# Patient Record
Sex: Female | Born: 1980 | Race: Black or African American | Hispanic: No | Marital: Married | Smoking: Former smoker
Health system: Southern US, Community
[De-identification: ages and names within clinical notes are randomized; demographics above are authoritative.]

## PROBLEM LIST (undated history)

## (undated) ENCOUNTER — Inpatient Hospital Stay (HOSPITAL_COMMUNITY): Payer: Self-pay

## (undated) ENCOUNTER — Inpatient Hospital Stay (HOSPITAL_COMMUNITY): Payer: BLUE CROSS/BLUE SHIELD | Admitting: Family Medicine

## (undated) DIAGNOSIS — D649 Anemia, unspecified: Secondary | ICD-10-CM

## (undated) DIAGNOSIS — Z3490 Encounter for supervision of normal pregnancy, unspecified, unspecified trimester: Secondary | ICD-10-CM

## (undated) HISTORY — DX: Anemia, unspecified: D64.9

---

## 2002-09-05 ENCOUNTER — Encounter: Payer: Self-pay | Admitting: Obstetrics and Gynecology

## 2002-09-05 ENCOUNTER — Ambulatory Visit (HOSPITAL_COMMUNITY): Admission: RE | Admit: 2002-09-05 | Discharge: 2002-09-05 | Payer: Self-pay | Admitting: Obstetrics and Gynecology

## 2003-01-07 ENCOUNTER — Inpatient Hospital Stay (HOSPITAL_COMMUNITY): Admission: AD | Admit: 2003-01-07 | Discharge: 2003-01-09 | Payer: Self-pay | Admitting: Obstetrics and Gynecology

## 2004-08-12 ENCOUNTER — Other Ambulatory Visit: Admission: RE | Admit: 2004-08-12 | Discharge: 2004-08-12 | Payer: Self-pay | Admitting: Obstetrics and Gynecology

## 2005-08-31 ENCOUNTER — Other Ambulatory Visit: Admission: RE | Admit: 2005-08-31 | Discharge: 2005-08-31 | Payer: Self-pay | Admitting: Obstetrics and Gynecology

## 2006-03-28 ENCOUNTER — Emergency Department (HOSPITAL_COMMUNITY): Admission: EM | Admit: 2006-03-28 | Discharge: 2006-03-28 | Payer: Self-pay | Admitting: Emergency Medicine

## 2008-09-25 ENCOUNTER — Inpatient Hospital Stay (HOSPITAL_COMMUNITY): Admission: AD | Admit: 2008-09-25 | Discharge: 2008-09-27 | Payer: Self-pay | Admitting: Obstetrics and Gynecology

## 2008-09-25 ENCOUNTER — Encounter (INDEPENDENT_AMBULATORY_CARE_PROVIDER_SITE_OTHER): Payer: Self-pay | Admitting: Obstetrics and Gynecology

## 2008-09-30 ENCOUNTER — Encounter: Admission: RE | Admit: 2008-09-30 | Discharge: 2008-10-23 | Payer: Self-pay | Admitting: Obstetrics and Gynecology

## 2009-06-12 ENCOUNTER — Emergency Department (HOSPITAL_COMMUNITY): Admission: EM | Admit: 2009-06-12 | Discharge: 2009-06-12 | Payer: Self-pay | Admitting: Emergency Medicine

## 2010-06-17 ENCOUNTER — Emergency Department (HOSPITAL_COMMUNITY): Admission: EM | Admit: 2010-06-17 | Discharge: 2010-06-17 | Payer: Self-pay | Admitting: Emergency Medicine

## 2010-08-11 ENCOUNTER — Emergency Department (HOSPITAL_COMMUNITY)
Admission: EM | Admit: 2010-08-11 | Discharge: 2010-08-11 | Payer: Self-pay | Source: Home / Self Care | Admitting: Family Medicine

## 2010-11-04 LAB — POCT RAPID STREP A (OFFICE): Streptococcus, Group A Screen (Direct): NEGATIVE

## 2010-12-08 LAB — CCBB MATERNAL DONOR DRAW

## 2010-12-08 LAB — RPR: RPR Ser Ql: NONREACTIVE

## 2010-12-08 LAB — CBC
HCT: 35.4 % — ABNORMAL LOW (ref 36.0–46.0)
HCT: 36.8 % (ref 36.0–46.0)
Hemoglobin: 11.7 g/dL — ABNORMAL LOW (ref 12.0–15.0)
MCHC: 33.1 g/dL (ref 30.0–36.0)
MCV: 87.2 fL (ref 78.0–100.0)
Platelets: 166 10*3/uL (ref 150–400)
Platelets: 168 10*3/uL (ref 150–400)
RBC: 4.06 MIL/uL (ref 3.87–5.11)
RDW: 14 % (ref 11.5–15.5)
RDW: 14.1 % (ref 11.5–15.5)
WBC: 11.8 10*3/uL — ABNORMAL HIGH (ref 4.0–10.5)
WBC: 15.4 10*3/uL — ABNORMAL HIGH (ref 4.0–10.5)

## 2010-12-08 LAB — STREP B DNA PROBE: Strep Group B Ag: NEGATIVE

## 2011-01-05 NOTE — Discharge Summary (Signed)
NAMECAMERIN, [name]                ACCOUNT NO.:  [id number]   MEDICAL RECORD NO.:  [id number]          PATIENT TYPE:  INP   LOCATION:  9146                          FACILITY:  [facility]   PHYSICIAN:  Sherron Monday, MD        DATE OF BIRTH:  1980/10/24   DATE OF ADMISSION:  09/25/2008  DATE OF DISCHARGE:  09/27/2008                               DISCHARGE SUMMARY   ADMITTING DIAGNOSIS:  Intrauterine pregnancy at 35 weeks with  spontaneous rupture of membranes.   DISCHARGE DIAGNOSIS:  Intrauterine pregnancy at 35 weeks with  spontaneous rupture of membranes.   HISTORY OF PRESENT ILLNESS:  A 30 year old G2, P1 at 35 weeks with  spontaneous rupture of membranes confirmed in MAU with an [facility] of October 27, 2008.  Her pregnancy is dated by her last menstrual period consistent  with an early ultrasound.  She had uncomplicated prenatal care aside  from early dilatation.   PAST MEDICAL HISTORY:  History of postpartum depression.   PAST SURGICAL HISTORY:  None.   PAST OB/GYN HISTORY:  G1 was a term spontaneous vaginal delivery of a  female infant.  G2 is the current pregnancy.  No abnormal Pap smears or  sexually transmitted diseases.   MEDICATIONS:  Prenatal vitamins.   ALLERGIES:  No known drug allergies.   SOCIAL HISTORY:  Denies alcohol, tobacco, or drug use.   FAMILY HISTORY:  Significant for hypertension in her mother.   PRENATAL LABORATORY DATA:  O positive, antibody screen negative.  Rubella immune.  RPR nonreactive.  Hepatitis C surface antigen negative.  HIV negative.  Gonorrhea and Chlamydia negative.  First trimester screen  within normal limits.  Cystic fibrosis screen negative.  AFP within  normal limits.  Glucola of 131.  History of positive group B strep, has  not been evaluated in this pregnancy.   PHYSICAL EXAMINATION:  On admission, she was afebrile.  Vital signs  stable with a benign exam.  Fetal heart tones are reactive.  Cervix is  noted to be 3 cm dilated, 70%  effaced, and -2 station.  Rupture of  membranes was again confirmed.  She was admitted and given penicillin  for group B strep prophylaxis and she was given epidural for comfort.  Her labor progressed to complete, complete + 2, with minimal amount of  Pitocin.  She pushed very well at times for approximately 10 minutes to  deliver a viable female infant at 78 on September 25, 2008, with a weight  of 6 pounds 4 ounces with Apgars of 8 at one minute and 9 at five  minutes.  Placenta was expressed intact.  Hemostatic perineal laceration  was the only laceration.  EBL was less than 500 mL.  Infant was  delivered with a loose nuchal cord.  NICU was present for delivery.  The  postpartum course was relatively uncomplicated.  She remained afebrile  with vital signs stable.  She was discharged to home on postpartum day  #2 with normal lochia.  Pain controlled, ambulating well, and tolerating  p.o.  She was breastfeeding without difficulty.  Hemoglobin had actually  increased from 11.7 to 12.1.  She was discharged to home with routine  discharge instructions and numbers to call if any questions or problems  as well as prescriptions for Motrin, Vicodin, prenatal vitamins, and  Zoloft secondary to a history of postpartum depression.  She will follow  up in 6 weeks and she knows to call with any questions with her meds.  She is O positive and rubella immune.  She continues both breast and  bottle feeds.  She will use an IUD for postpartum contraception.  This  will be placed at her postpartum checkup, and her hemoglobin changed  from 11.7 to 12.1.      Sherron Monday, MD  Electronically Signed     JB/MEDQ  D:  09/27/2008  T:  09/27/2008  Job:  [id number]

## 2011-01-08 NOTE — Discharge Summary (Signed)
NAMEKAMIL, HANIGAN                          ACCOUNT NO.:  [id number]   MEDICAL RECORD NO.:  [id number]                   PATIENT TYPE:  INP   LOCATION:  9126                                 [facility]ILITY:  [facility]   PHYSICIAN:  Huel Cote, M.D.              DATE OF BIRTH:  08-Aug-1981   DATE OF ADMISSION:  01/07/2003  DATE OF DISCHARGE:  01/09/2003                                 DISCHARGE SUMMARY   DISCHARGE DIAGNOSES:  1. Term pregnancy at 39 weeks, delivered.  2. Status post normal spontaneous vaginal delivery.  3. Group B Streptococcus carrier.   DISCHARGE MEDICATIONS:  Motrin 600 mg p.o. every six hours.   DISCHARGE FOLLOW UP:  Patient is to follow up in six weeks for her routine  postpartum exam.   HOSPITAL COURSE:  Patient is a 30 year old G1, P0 who was admitted at 39+  weeks for labor and delivery for induction of labor given a very favorable  cervix and possibility of her husband having to travel soon with [employer]  training.  Prenatal care was uncomplicated except for a positive group B  strep status.  Patient presented with mild contractions, no leakage of fluid  or vaginal bleeding, and good fetal movement.  Prenatal labs were as  follows:  O positive, antibody negative, RPR nonreactive, Rubella immune,  hepatitis B surface antigen negative, HIV negative, GC negative, Chlamydia  negative, triple screen negative, CF negative, sickle negative, and group B  strep positive.  She had no obstetrical history.   PAST GYNECOLOGICAL HISTORY:  1. Remote history of Chlamydia, which was treated.  2. 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   MEDICATIONS:  None.   PAST SURGICAL HISTORY:  None.   ALLERGIES:  She has no known drug allergies.   PHYSICAL EXAMINATION:  VITAL SIGNS:  She was afebrile with stable vital  signs.  ABDOMEN:  Fetal heart rate was reactive.  She was gravid and nontender.  Estimated fetal weight was 7 to 7-1/2 pounds.  PELVIC:  Cervix was 2 to 3, 75%, and a -2 station.   HOSPITAL COURSE:  She had rupture of membranes performed with clear fluid  obtained and was placed on Pitocin, per low-dose protocol.  She progressed  throughout the day and reached complete dilation and pushed for  approximately 30 minutes with a normal spontaneous vaginal delivery of a  vigorous female infant.  Apgars were 9 and 9.  Weight was 7 pounds 7 ounces.  Placenta delivered spontaneously with a three-vessel cord.  There was a  small, first-degree laceration repaired with 2-0 Vicryl for hemostasis, and  the cervix and rectum were intact.  Estimated blood loss was 350 mL.  She was then admitted for routine  postpartum care and did very well and on postpartum day number two, was  afebrile and having no pain.  She was therefore felt stable for discharge  and was discharged home to follow up in the office as previously stated in  six weeks.                                               Huel Cote, M.D.    KR/MEDQ  D:  01/09/2003  T:  01/09/2003  Job:  [id number]

## 2011-02-08 ENCOUNTER — Telehealth: Payer: Self-pay | Admitting: *Deleted

## 2011-02-08 ENCOUNTER — Ambulatory Visit: Payer: Self-pay | Admitting: Family Medicine

## 2011-02-08 NOTE — Telephone Encounter (Signed)
New Pt appt (30 min) No Show.  I called pt cell and left a VM regarding our office policy, please call to explain reason for missed appt.

## 2012-08-06 ENCOUNTER — Emergency Department (HOSPITAL_COMMUNITY)
Admission: EM | Admit: 2012-08-06 | Discharge: 2012-08-06 | Disposition: A | Discharge status: Home / Self Care | Payer: BC Managed Care – PPO | Source: Home / Self Care

## 2012-12-14 ENCOUNTER — Emergency Department (INDEPENDENT_AMBULATORY_CARE_PROVIDER_SITE_OTHER)
Admission: EM | Admit: 2012-12-14 | Discharge: 2012-12-14 | Disposition: A | Discharge status: Home / Self Care | Payer: BC Managed Care – PPO | Source: Home / Self Care | Attending: Family Medicine | Admitting: Family Medicine

## 2012-12-14 ENCOUNTER — Encounter (HOSPITAL_COMMUNITY): Payer: Self-pay | Admitting: *Deleted

## 2012-12-14 DIAGNOSIS — S61311A Laceration without foreign body of left index finger with damage to nail, initial encounter: Secondary | ICD-10-CM

## 2012-12-14 DIAGNOSIS — S61209A Unspecified open wound of unspecified finger without damage to nail, initial encounter: Secondary | ICD-10-CM

## 2012-12-14 NOTE — ED Provider Notes (Signed)
History     CSN: [id number]  Arrival date & time 12/14/12  1844   First MD Initiated Contact with Patient 12/14/12 1917      No chief complaint on file.   (Consider location/radiation/quality/duration/timing/severity/associated sxs/prior treatment) Patient is a 32 y.o. female presenting with hand pain. The history is provided by the patient. No language interpreter was used.  Hand Pain This is a new problem. The current episode started less than 1 hour ago. The problem occurs constantly. The problem has not changed since onset.Nothing aggravates the symptoms. Nothing relieves the symptoms. She has tried nothing for the symptoms.   Pt cut left index finger with a knife No past medical history on file.  No past surgical history on file.  No family history on file.  History  Substance Use Topics  . Smoking status: Not on file  . Smokeless tobacco: Not on file  . Alcohol Use: Not on file    OB History   No data available      Review of Systems  All other systems reviewed and are negative.    Allergies  Review of patient's allergies indicates not on file.  Home Medications  No current outpatient prescriptions on file.  BP 109/70  Pulse 77  Temp(Src) 98.1 F (36.7 C) (Oral)  Resp 16  SpO2 100%  Physical Exam  Constitutional: She is oriented to person, place, and time.  Musculoskeletal: She exhibits tenderness.  1cm superficial laceration left index finger  Neurological: She is alert and oriented to person, place, and time. She has normal reflexes.  Skin: Skin is warm.  Psychiatric: She has a normal mood and affect.    ED Course  LACERATION REPAIR Date/Time: 12/14/2012 7:21 PM Performed by: Elson Areas Authorized by: Bradd Canary D Consent: Verbal consent obtained. Risks and benefits: risks, benefits and alternatives were discussed Consent given by: patient Required items: required blood products, implants, devices, and special equipment  available Patient identity confirmed: verbally with patient Laceration length: 1 cm Foreign bodies: no foreign bodies Tendon involvement: none Irrigation solution: saline Amount of cleaning: standard Skin closure: glue Patient tolerance: Patient tolerated the procedure well with no immediate complications.   (including critical care time)  Labs Reviewed - No data to display No results found.   1. Laceration of left index finger w/o foreign body with damage to nail, initial encounter       MDM  Pt counseled on wound care.        Lonia Skinner [place], PA-C 12/14/12 1921

## 2012-12-14 NOTE — ED Notes (Signed)
Lac  To  l  Pointer         And    Cut  Finger  On  Can   Lid            Bleeding       Subsided

## 2012-12-15 NOTE — ED Provider Notes (Signed)
Medical screening examination/treatment/procedure(s) were performed by resident physician or non-physician practitioner and as supervising physician I was immediately available for consultation/collaboration.   Monterio Bob DOUGLAS MD.   Callen Zuba D Elianis Fischbach, MD 12/15/12 2024 

## 2015-05-15 ENCOUNTER — Ambulatory Visit (INDEPENDENT_AMBULATORY_CARE_PROVIDER_SITE_OTHER): Payer: BLUE CROSS/BLUE SHIELD | Admitting: Physician Assistant

## 2015-05-15 ENCOUNTER — Encounter: Payer: Self-pay | Admitting: Physician Assistant

## 2015-05-15 VITALS — BP 114/70 | HR 72 | Temp 98.1°F | Resp 16 | Ht 66.0 in | Wt 172.0 lb

## 2015-05-15 DIAGNOSIS — Z0000 Encounter for general adult medical examination without abnormal findings: Secondary | ICD-10-CM | POA: Diagnosis not present

## 2015-05-15 DIAGNOSIS — Z23 Encounter for immunization: Secondary | ICD-10-CM

## 2015-05-15 LAB — POCT URINALYSIS DIPSTICK
BILIRUBIN UA: NEGATIVE
GLUCOSE UA: NEGATIVE
Ketones, UA: NEGATIVE
Leukocytes, UA: NEGATIVE
Nitrite, UA: NEGATIVE
Protein, UA: NEGATIVE
RBC UA: NEGATIVE
SPEC GRAV UA: 1.015
Urobilinogen, UA: 0.2
pH, UA: 7.5

## 2015-05-15 NOTE — Progress Notes (Signed)
Patient: [name], Female    DOB: 05/15/1981, 34 y.o.   MRN: [id number] Visit Date: 05/15/2015  Today's Provider: Mar Daring, PA-C   Chief Complaint  Patient presents with  . Establish Care  . Annual Exam   Subjective:    Annual physical exam [name] is a 34 y.o. female who presents today for health maintenance and complete physical. She feels well. She reports not exercising . She reports she is sleeping well. She is doing well today and has no complaints. 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 She is followed by Dr. Christophe Louis in [place] for Ob/Gyn. She had a Pap smear March of this year and is reported normal. She has had 2 children age 55, girl and 4, boy. She has never had any abnormal Pap smears. There is no family history of ovarian or cervical cancer. There is no family history of breast cancer. She does perform monthly self breast exams. There is no family history of colon cancer. 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  Pap Smear: 10/2014 -----------------------------------------------------------------   Review of Systems  Constitutional: Negative.   HENT: Negative.   Eyes: Negative.   Respiratory: Negative.   Cardiovascular: Negative.   Gastrointestinal: Negative.   Endocrine: Negative.   Genitourinary: Negative.   Musculoskeletal: Negative.   Skin: Negative.   Allergic/Immunologic: Negative.   Neurological: Negative.   Hematological: Negative.   Psychiatric/Behavioral: Negative.     Social History She  reports that she has never smoked. She does not have any smokeless tobacco history on file. She reports that she does not drink alcohol. Social History   Social History  . Marital Status: Married    Spouse Name: N/A  . Number of Children: N/A  . Years of Education: N/A   Social History Main Topics    . Smoking status: Never Smoker   . Smokeless tobacco: None  . Alcohol Use: No  . Drug Use: None  . Sexual Activity: Not Asked   Other Topics Concern  . None   Social History Narrative    There are no active problems to display for this patient.   History reviewed. No pertinent past surgical history.  Family History  Family Status  Relation Status Death Age  . Mother Alive   . Father Deceased 81    Lung Cancer   Her family history is not on file.    No Known Allergies  Previous Medications   MULTIPLE VITAMIN (MULTIVITAMINS PO)    Take by mouth daily.    Patient Care Team: Mar Daring, PA-C as PCP - General (Physician Assistant)     Objective:   Vitals: BP 114/70 mmHg  Pulse 72  Temp(Src) 98.1 F (36.7 C) (Oral)  Resp 16  Ht $R'5\' 6"'yI$  (1.676 m)  Wt 172 lb (78.019 kg)  BMI 27.77 kg/m2  LMP 01/22/2015   Physical Exam  Constitutional: She is oriented to person, place, and time. She appears well-developed and well-nourished. No distress.  HENT:  Head: Normocephalic and atraumatic.  Right Ear: External ear normal.  Left Ear: External ear normal.  Nose: Nose normal.  Mouth/Throat: Oropharynx is clear and moist. No oropharyngeal exudate.  Eyes: Conjunctivae and EOM are normal. Pupils are equal, round, and reactive to light. Right eye exhibits no discharge. Left eye exhibits no discharge.  No scleral icterus.  Neck: Normal range of motion. Neck supple. No JVD present. No tracheal deviation present. No thyromegaly present.  Cardiovascular: Normal rate, regular rhythm, normal heart sounds and intact distal pulses.  Exam reveals no gallop and no friction rub.   No murmur heard. Pulmonary/Chest: Effort normal and breath sounds normal. No respiratory distress. She has no wheezes. She has no rales. She exhibits no tenderness.  Abdominal: Soft. Bowel sounds are normal. She exhibits no distension and no mass. There is no tenderness. There is no rebound and no guarding.   Genitourinary:  Done by Dr. Landry Mellow, [facility]  Musculoskeletal: Normal range of motion. She exhibits no edema or tenderness.  Lymphadenopathy:    She has no cervical adenopathy.  Neurological: She is alert and oriented to person, place, and time.  Skin: Skin is warm and dry. No rash noted. She is not diaphoretic.  Psychiatric: She has a normal mood and affect. Her behavior is normal. Judgment and thought content normal.  Vitals reviewed.    Depression Screen No flowsheet data found.    Assessment & Plan:     Routine Health Maintenance and Physical Exam  Exercise Activities and Dietary recommendations Goals    None      Immunization History  Administered Date(s) Administered  . Hepatitis B 04/09/2013  . MMR 02/16/2007  . Tdap 02/16/2007  . Varicella 03/17/2005    Health Maintenance  Topic Date Due  . HIV Screening  11/02/1997  . TETANUS/TDAP  03/24/2007  . PAP SMEAR  11/02/2017  . INFLUENZA VACCINE  03/24/2015      Discussed health benefits of physical activity, and encouraged her to engage in regular exercise appropriate for her age and condition.   1. Annual physical exam I will check labs as she has never had any lab work done. I will follow-up with her pending lab results. - CBC with Differential - Lipid panel - Comprehensive Metabolic Panel (CMET) - TSH - POCT Urinalysis Dipstick  2. Need for influenza vaccination Flu vaccine given today in the office. She tolerated this well. - Flu Vaccine QUAD 36+ mos IM  --------------------------------------------------------------------

## 2015-05-15 NOTE — Patient Instructions (Signed)

## 2015-05-22 ENCOUNTER — Telehealth: Payer: Self-pay

## 2015-05-22 LAB — COMPREHENSIVE METABOLIC PANEL
A/G RATIO: 1.6 (ref 1.1–2.5)
ALT: 18 IU/L (ref 0–32)
AST: 15 IU/L (ref 0–40)
Albumin: 4.4 g/dL (ref 3.5–5.5)
Alkaline Phosphatase: 65 IU/L (ref 39–117)
BUN/Creatinine Ratio: 12 (ref 8–20)
BUN: 10 mg/dL (ref 6–20)
Bilirubin Total: 0.3 mg/dL (ref 0.0–1.2)
CALCIUM: 9.7 mg/dL (ref 8.7–10.2)
CO2: 22 mmol/L (ref 18–29)
Chloride: 100 mmol/L (ref 97–108)
Creatinine, Ser: 0.85 mg/dL (ref 0.57–1.00)
GFR, EST AFRICAN AMERICAN: 105 mL/min/{1.73_m2} (ref >59)
GFR, EST NON AFRICAN AMERICAN: 91 mL/min/{1.73_m2} (ref >59)
GLUCOSE: 88 mg/dL (ref 65–99)
Globulin, Total: 2.7 g/dL (ref 1.5–4.5)
POTASSIUM: 4.7 mmol/L (ref 3.5–5.2)
Sodium: 140 mmol/L (ref 134–144)
TOTAL PROTEIN: 7.1 g/dL (ref 6.0–8.5)

## 2015-05-22 LAB — LIPID PANEL
CHOL/HDL RATIO: 3 ratio (ref 0.0–4.4)
Cholesterol, Total: 209 mg/dL — ABNORMAL HIGH (ref 100–199)
HDL: 70 mg/dL (ref >39)
LDL CALC: 120 mg/dL — AB (ref 0–99)
TRIGLYCERIDES: 94 mg/dL (ref 0–149)
VLDL Cholesterol Cal: 19 mg/dL (ref 5–40)

## 2015-05-22 LAB — CBC WITH DIFFERENTIAL/PLATELET
Basophils Absolute: 0 10*3/uL (ref 0.0–0.2)
Basos: 1 %
EOS (ABSOLUTE): 0.1 10*3/uL (ref 0.0–0.4)
EOS: 2 %
HEMATOCRIT: 40.9 % (ref 34.0–46.6)
HEMOGLOBIN: 13.5 g/dL (ref 11.1–15.9)
Immature Grans (Abs): 0 10*3/uL (ref 0.0–0.1)
Immature Granulocytes: 0 %
LYMPHS ABS: 2.5 10*3/uL (ref 0.7–3.1)
Lymphs: 48 %
MCH: 26.3 pg — AB (ref 26.6–33.0)
MCHC: 33 g/dL (ref 31.5–35.7)
MCV: 80 fL (ref 79–97)
MONOCYTES: 8 %
MONOS ABS: 0.4 10*3/uL (ref 0.1–0.9)
NEUTROS ABS: 2.2 10*3/uL (ref 1.4–7.0)
Neutrophils: 41 %
Platelets: 203 10*3/uL (ref 150–379)
RBC: 5.14 x10E6/uL (ref 3.77–5.28)
RDW: 13.8 % (ref 12.3–15.4)
WBC: 5.3 10*3/uL (ref 3.4–10.8)

## 2015-05-22 LAB — TSH: TSH: 0.928 u[IU]/mL (ref 0.450–4.500)

## 2015-05-22 NOTE — Telephone Encounter (Signed)
Patient advised as directed below.  Thanks,  -Joseline 

## 2015-05-22 NOTE — Telephone Encounter (Signed)
-----   Message from Mar Daring, [place] sent at 05/22/2015  8:55 AM EDT ----- All labs are stable and WNL with exception of cholesterol which is only borderline elevated.  Good cholesterol is elevated also which offers cardioprotection.  Try to limit high cholesterol and high fat foods.  Exercise at least 3-4 times per week.  We will recheck next year with physical.

## 2016-01-04 ENCOUNTER — Inpatient Hospital Stay (HOSPITAL_COMMUNITY)
Admission: AD | Admit: 2016-01-04 | Discharge: 2016-01-05 | Disposition: A | Discharge status: Home / Self Care | Payer: BLUE CROSS/BLUE SHIELD | Source: Ambulatory Visit | Attending: Obstetrics & Gynecology | Admitting: Obstetrics & Gynecology

## 2016-01-04 ENCOUNTER — Encounter (HOSPITAL_COMMUNITY): Payer: Self-pay | Admitting: *Deleted

## 2016-01-04 DIAGNOSIS — Z3A08 8 weeks gestation of pregnancy: Secondary | ICD-10-CM | POA: Insufficient documentation

## 2016-01-04 DIAGNOSIS — O209 Hemorrhage in early pregnancy, unspecified: Secondary | ICD-10-CM | POA: Diagnosis not present

## 2016-01-04 DIAGNOSIS — Z87891 Personal history of nicotine dependence: Secondary | ICD-10-CM | POA: Insufficient documentation

## 2016-01-04 DIAGNOSIS — O43891 Other placental disorders, first trimester: Secondary | ICD-10-CM

## 2016-01-04 DIAGNOSIS — O4691 Antepartum hemorrhage, unspecified, first trimester: Secondary | ICD-10-CM | POA: Diagnosis not present

## 2016-01-04 LAB — CBC
HCT: 36.4 % (ref 36.0–46.0)
Hemoglobin: 12 g/dL (ref 12.0–15.0)
MCH: 26.2 pg (ref 26.0–34.0)
MCHC: 33 g/dL (ref 30.0–36.0)
MCV: 79.5 fL (ref 78.0–100.0)
PLATELETS: 202 10*3/uL (ref 150–400)
RBC: 4.58 MIL/uL (ref 3.87–5.11)
RDW: 14.2 % (ref 11.5–15.5)
WBC: 9.3 10*3/uL (ref 4.0–10.5)

## 2016-01-04 LAB — POCT PREGNANCY, URINE: Preg Test, Ur: POSITIVE — AB

## 2016-01-04 LAB — OB RESULTS CONSOLE GC/CHLAMYDIA: Gonorrhea: NEGATIVE

## 2016-01-04 NOTE — MAU Note (Signed)
Pt noticed some vaginal bleeding and cramping that started about an hour ago. Noticed bright red blood when wiping. Suppose to have appointment with Dr Landry Mellow tomorrow. Was given EDD of 08/17/2016 by [employer] in [place].

## 2016-01-04 NOTE — MAU Note (Signed)
PT  SAYS SHE STARTED HAVING CRAMPING  AT 9PM  AND VAG BLEEDING  AT 2300   WHEN SHE WIPED-   RED.   IN TRIAGE - LESS  RED  WHEN WIPING        HAS AN APPOINTMENT  TOMORROW  WITH DR  Landry Mellow.      HAD SEX THIS  AM.

## 2016-01-05 ENCOUNTER — Telehealth: Payer: Self-pay | Admitting: Obstetrics and Gynecology

## 2016-01-05 ENCOUNTER — Inpatient Hospital Stay (HOSPITAL_COMMUNITY): Payer: BLUE CROSS/BLUE SHIELD

## 2016-01-05 DIAGNOSIS — O4691 Antepartum hemorrhage, unspecified, first trimester: Secondary | ICD-10-CM

## 2016-01-05 LAB — WET PREP, GENITAL
Clue Cells Wet Prep HPF POC: NONE SEEN
SPERM: NONE SEEN
TRICH WET PREP: NONE SEEN
YEAST WET PREP: NONE SEEN

## 2016-01-05 LAB — URINALYSIS, ROUTINE W REFLEX MICROSCOPIC
Bilirubin Urine: NEGATIVE
Glucose, UA: NEGATIVE mg/dL
Ketones, ur: NEGATIVE mg/dL
NITRITE: NEGATIVE
Protein, ur: NEGATIVE mg/dL
SPECIFIC GRAVITY, URINE: 1.01 (ref 1.005–1.030)
pH: 8 (ref 5.0–8.0)

## 2016-01-05 LAB — ABO/RH: ABO/RH(D): O POS

## 2016-01-05 LAB — URINE MICROSCOPIC-ADD ON

## 2016-01-05 LAB — GC/CHLAMYDIA PROBE AMP (~~LOC~~) NOT AT ARMC
Chlamydia: NEGATIVE
Neisseria Gonorrhea: NEGATIVE

## 2016-01-05 LAB — HCG, QUANTITATIVE, PREGNANCY: hCG, Beta Chain, Quant, S: 112767 m[IU]/mL — ABNORMAL HIGH (ref <5)

## 2016-01-05 NOTE — MAU Provider Note (Signed)
History     CSN: [id number]  Arrival date and time: 01/04/16 2313   First Provider Initiated Contact with Patient 01/05/16 0003      Chief Complaint  Patient presents with  . Vaginal Bleeding  . Pelvic Pain   Vaginal Bleeding This is a new problem. The current episode started today. The problem occurs intermittently. The problem has been resolved. The patient is experiencing no pain. The problem affects both sides. She is pregnant. Associated symptoms include abdominal pain and constipation (took milk of mag tonight. Has not had a BM as of yet. ). Pertinent negatives include no chills, diarrhea, dysuria, fever, frequency, nausea, urgency or vomiting. The vaginal discharge was bloody. The vaginal bleeding is spotting (only when wiping ). She has not been passing clots. She has not been passing tissue. The symptoms are aggravated by intercourse (patient had intercourse earlier today ). She has tried acetaminophen for the symptoms. The treatment provided significant relief. She is sexually active. It is unknown whether or not her partner has an STD. Her menstrual history has been irregular (LMP: 11/13/15, pregnant via IUI and ovulation induction ).    History reviewed. No pertinent past medical history.  History reviewed. No pertinent past surgical history.  History reviewed. No pertinent family history.  Social History  Substance Use Topics  . Smoking status: Former Research scientist (life sciences)  . Smokeless tobacco: None  . Alcohol Use: No    Allergies: No Known Allergies  Prescriptions prior to admission  Medication Sig Dispense Refill Last Dose  . Multiple Vitamin (MULTIVITAMINS PO) Take by mouth daily.   01/04/2016 at Unknown time    Review of Systems  Constitutional: Negative for fever and chills.  Gastrointestinal: Positive for abdominal pain and constipation (took milk of mag tonight. Has not had a BM as of yet. ). Negative for nausea, vomiting and diarrhea.  Genitourinary: Positive for vaginal  bleeding. Negative for dysuria, urgency and frequency.   Physical Exam   Blood pressure 123/70, pulse 90, temperature 98.6 F (37 C), temperature source Oral, resp. rate 16, height 5\' 6"  (1.676 m), weight 85.276 kg (188 lb), last menstrual period 11/13/2015, SpO2 100 %.  Physical Exam  Nursing note and vitals reviewed. Constitutional: She is oriented to person, place, and time. She appears well-developed and well-nourished. No distress.  HENT:  Head: Normocephalic.  Cardiovascular: Normal rate.   Respiratory: Effort normal.  GI: Soft. There is no tenderness. There is no rebound.  Musculoskeletal: Normal range of motion.  Neurological: She is alert and oriented to person, place, and time.  Skin: Skin is warm and dry.  Psychiatric: She has a normal mood and affect.   Results for orders placed or performed during the hospital encounter of 01/04/16 (from the past 24 hour(s))  Urinalysis, Routine w reflex microscopic (not at [facility])     Status: Abnormal   Collection Time: 01/04/16 11:22 PM  Result Value Ref Range   Color, Urine YELLOW YELLOW   APPearance CLEAR CLEAR   Specific Gravity, Urine 1.010 1.005 - 1.030   pH 8.0 5.0 - 8.0   Glucose, UA NEGATIVE NEGATIVE mg/dL   Hgb urine dipstick LARGE (A) NEGATIVE   Bilirubin Urine NEGATIVE NEGATIVE   Ketones, ur NEGATIVE NEGATIVE mg/dL   Protein, ur NEGATIVE NEGATIVE mg/dL   Nitrite NEGATIVE NEGATIVE   Leukocytes, UA TRACE (A) NEGATIVE  Urine microscopic-add on     Status: Abnormal   Collection Time: 01/04/16 11:22 PM  Result Value Ref Range   Squamous Epithelial /  LPF 0-5 (A) NONE SEEN   WBC, UA 0-5 0 - 5 WBC/hpf   RBC / HPF 0-5 0 - 5 RBC/hpf   Bacteria, UA FEW (A) NONE SEEN  CBC     Status: None   Collection Time: 01/04/16 11:32 PM  Result Value Ref Range   WBC 9.3 4.0 - 10.5 K/uL   RBC 4.58 3.87 - 5.11 MIL/uL   Hemoglobin 12.0 12.0 - 15.0 g/dL   HCT 36.4 36.0 - 46.0 %   MCV 79.5 78.0 - 100.0 fL   MCH 26.2 26.0 - 34.0 pg   MCHC  33.0 30.0 - 36.0 g/dL   RDW 14.2 11.5 - 15.5 %   Platelets 202 150 - 400 K/uL  hCG, quantitative, pregnancy     Status: Abnormal   Collection Time: 01/04/16 11:32 PM  Result Value Ref Range   hCG, Beta Chain, [place], S 112767 (H) <5 mIU/mL  ABO/Rh     Status: None (Preliminary result)   Collection Time: 01/04/16 11:32 PM  Result Value Ref Range   ABO/RH(D) O POS   Pregnancy, urine POC     Status: Abnormal   Collection Time: 01/04/16 11:36 PM  Result Value Ref Range   Preg Test, Ur POSITIVE (A) NEGATIVE  Wet prep, genital     Status: Abnormal   Collection Time: 01/04/16 11:50 PM  Result Value Ref Range   Yeast Wet Prep HPF POC NONE SEEN NONE SEEN   Trich, Wet Prep NONE SEEN NONE SEEN   Clue Cells Wet Prep HPF POC NONE SEEN NONE SEEN   WBC, Wet Prep HPF POC FEW (A) NONE SEEN   Sperm NONE SEEN    US Ob Comp Less 14 Wks  01/05/2016  CLINICAL DATA:  Bleeding and cramping since 10 p.m. Estimated gestational age by LMP is 7 weeks 4 days. Quantitative beta HCG is pending. EXAM: OBSTETRIC <14 WK ULTRASOUND TECHNIQUE: Transabdominal ultrasound was performed for evaluation of the gestation as well as the maternal uterus and adnexal regions. COMPARISON:  None. FINDINGS: Intrauterine gestational sac: Single intrauterine gestational sac is identified. Yolk sac:  Yolk sac is present. Embryo:  Fetal pole is present. Cardiac Activity: Fetal cardiac activity is identified. Heart Rate: 165 bpm CRL:   16  mm   8 W 0 d                  [place] EDC: 08/16/2016 Subchorionic hemorrhage: A large subchorionic hemorrhage is identified inferiorly, measuring 3.9 x 2.2 x 4.3 cm. Maternal uterus/adnexae: Uterus appears anteverted and retroflexed. There appears to be in intramural fibroid lateral and to the left measuring about 4.1 cm maximal diameter. Both ovaries are visualized and appear normal. Corpus luteal cyst on the left. No free fluid. IMPRESSION: Single intrauterine pregnancy. Estimated gestational age by crown-rump  length is 8 weeks 0 days. Large subchorionic hemorrhages demonstrated. Electronically Signed   By: Lucienne Capers M.D.   On: 01/05/2016 01:09    MAU Course  Procedures  MDM   Assessment and Plan   1. Subchorionic hematoma, first trimester   2. Vaginal bleeding in pregnancy, first trimester    DC home Comfort measures reviewed  1st Trimester precautions  Bleeding precautions RX: none Return to MAU as needed FU with OB as planned  Follow-up Information    Follow up with Catha Brow., MD.   Specialty:  Obstetrics and Gynecology   Why:  As scheduled   Contact information:   [address]  [place] [address] [phone]         Mathis Bud 01/05/2016, 12:05 AM

## 2016-01-05 NOTE — Discharge Instructions (Signed)

## 2016-01-05 NOTE — Telephone Encounter (Signed)
TC from patient--patient of Dr. Landry Mellow.  Seen in MAU today with early pregnancy bleeding, with SIUP, 8 weeks, FHR 165, with large [place] noted.  Home with bleeding minimal, just had BM and saw small amount bleeding.  No pain. O+ type. Issue of [facility] reviewed.. Will continue to observe for heavier bleeding or severe pain, will f/u with Dr. Sundra Aland office in the am to schedule f/u.  Support offered for concerns.

## 2016-01-06 LAB — HIV ANTIBODY (ROUTINE TESTING W REFLEX): HIV SCREEN 4TH GENERATION: NONREACTIVE

## 2016-01-06 LAB — RPR: RPR: NONREACTIVE

## 2016-01-28 ENCOUNTER — Other Ambulatory Visit: Payer: Self-pay | Admitting: Obstetrics and Gynecology

## 2016-01-28 ENCOUNTER — Other Ambulatory Visit (HOSPITAL_COMMUNITY)
Admission: RE | Admit: 2016-01-28 | Discharge: 2016-01-28 | Disposition: A | Discharge status: Home / Self Care | Payer: BLUE CROSS/BLUE SHIELD | Source: Ambulatory Visit | Attending: Obstetrics and Gynecology | Admitting: Obstetrics and Gynecology

## 2016-01-28 DIAGNOSIS — Z01419 Encounter for gynecological examination (general) (routine) without abnormal findings: Secondary | ICD-10-CM | POA: Diagnosis present

## 2016-01-28 DIAGNOSIS — Z1151 Encounter for screening for human papillomavirus (HPV): Secondary | ICD-10-CM | POA: Diagnosis present

## 2016-01-28 DIAGNOSIS — Z113 Encounter for screening for infections with a predominantly sexual mode of transmission: Secondary | ICD-10-CM | POA: Diagnosis present

## 2016-01-30 LAB — CYTOLOGY - PAP

## 2016-03-08 ENCOUNTER — Ambulatory Visit (HOSPITAL_COMMUNITY)
Admission: EM | Admit: 2016-03-08 | Discharge: 2016-03-08 | Disposition: A | Discharge status: Home / Self Care | Payer: BLUE CROSS/BLUE SHIELD | Attending: Emergency Medicine | Admitting: Emergency Medicine

## 2016-03-08 ENCOUNTER — Encounter (HOSPITAL_COMMUNITY): Payer: Self-pay | Admitting: Emergency Medicine

## 2016-03-08 DIAGNOSIS — Z87891 Personal history of nicotine dependence: Secondary | ICD-10-CM | POA: Insufficient documentation

## 2016-03-08 DIAGNOSIS — Z79899 Other long term (current) drug therapy: Secondary | ICD-10-CM | POA: Diagnosis not present

## 2016-03-08 DIAGNOSIS — J302 Other seasonal allergic rhinitis: Secondary | ICD-10-CM | POA: Diagnosis not present

## 2016-03-08 DIAGNOSIS — J029 Acute pharyngitis, unspecified: Secondary | ICD-10-CM

## 2016-03-08 DIAGNOSIS — J028 Acute pharyngitis due to other specified organisms: Secondary | ICD-10-CM | POA: Diagnosis not present

## 2016-03-08 DIAGNOSIS — J309 Allergic rhinitis, unspecified: Secondary | ICD-10-CM | POA: Diagnosis present

## 2016-03-08 HISTORY — DX: Encounter for supervision of normal pregnancy, unspecified, unspecified trimester: Z34.90

## 2016-03-08 LAB — POCT RAPID STREP A: STREPTOCOCCUS, GROUP A SCREEN (DIRECT): NEGATIVE

## 2016-03-08 NOTE — Discharge Instructions (Signed)
Allergic Rhinitis Recommend taking an antihistamine such a Zyrtec, Claritin or Allegra for drainage. By minimizing the drainage this will help your throat feel better. He may use Cepacol lozenges. The sure to drink plenty of fluids and stay well-hydrated Use lots of saline nasal spray and frequently Consider using Flonase or Rhinocort nasal spray for the next several days. Allergic rhinitis is when the mucous membranes in the nose respond to allergens. Allergens are particles in the air that cause your body to have an allergic reaction. This causes you to release allergic antibodies. Through a chain of events, these eventually cause you to release histamine into the blood stream. Although meant to protect the body, it is this release of histamine that causes your discomfort, such as frequent sneezing, congestion, and an itchy, runny nose.  CAUSES Seasonal allergic rhinitis (hay fever) is caused by pollen allergens that may come from grasses, trees, and weeds. Year-round allergic rhinitis (perennial allergic rhinitis) is caused by allergens such as house dust mites, pet dander, and mold spores. SYMPTOMS  Nasal stuffiness (congestion).  Itchy, runny nose with sneezing and tearing of the eyes. DIAGNOSIS Your health care provider can help you determine the allergen or allergens that trigger your symptoms. If you and your health care provider are unable to determine the allergen, skin or blood testing may be used. Your health care provider will diagnose your condition after taking your health history and performing a physical exam. Your health care provider may assess you for other related conditions, such as asthma, pink eye, or an ear infection. TREATMENT Allergic rhinitis does not have a cure, but it can be controlled by:  Medicines that block allergy symptoms. These may include allergy shots, nasal sprays, and oral antihistamines.  Avoiding the allergen. Hay fever may often be treated with  antihistamines in pill or nasal spray forms. Antihistamines block the effects of histamine. There are over-the-counter medicines that may help with nasal congestion and swelling around the eyes. Check with your health care provider before taking or giving this medicine. If avoiding the allergen or the medicine prescribed do not work, there are many new medicines your health care provider can prescribe. Stronger medicine may be used if initial measures are ineffective. Desensitizing injections can be used if medicine and avoidance does not work. Desensitization is when a patient is given ongoing shots until the body becomes less sensitive to the allergen. Make sure you follow up with your health care provider if problems continue. HOME CARE INSTRUCTIONS It is not possible to completely avoid allergens, but you can reduce your symptoms by taking steps to limit your exposure to them. It helps to know exactly what you are allergic to so that you can avoid your specific triggers. SEEK MEDICAL CARE IF:  You have a fever.  You develop a cough that does not stop easily (persistent).  You have shortness of breath.  You start wheezing.  Symptoms interfere with normal daily activities.   This information is not intended to replace advice given to you by your health care provider. Make sure you discuss any questions you have with your health care provider.   Document Released: 05/04/2001 Document Revised: 08/30/2014 Document Reviewed: 04/16/2013 Elsevier Interactive Patient Education [employer].

## 2016-03-08 NOTE — ED Notes (Signed)
Patient is [redacted] weeks pregnant.  Patient is in department today for drainage, cough, sore throat, congestion, unknown fever.  Patient reports she returned last night from a resort in [place].

## 2016-03-08 NOTE — ED Provider Notes (Signed)
CSN: [id number]     Arrival date & time 03/08/16  [id number] History   First MD Initiated Contact with Patient 03/08/16 1021     Chief Complaint  Patient presents with  . URI   (Consider location/radiation/quality/duration/timing/severity/associated sxs/prior Treatment) HPI Comments: 35 year old female arrived from [place] yesterday. She states that soon after arrival she developed PND, runny nose, cough, sore throat and nasal stuffiness. She states she is [redacted] weeks pregnant and wants to make sure that she is okay and does not have strep throat.  No myalgias, fevers, headache, rash, asthenia.   Past Medical History  Diagnosis Date  . Pregnant    History reviewed. No pertinent past surgical history. No family history on file. Social History  Substance Use Topics  . Smoking status: Former Research scientist (life sciences)  . Smokeless tobacco: None  . Alcohol Use: No   OB History    Gravida Para Term Preterm AB TAB SAB Ectopic Multiple Living   3 2        2      Review of Systems  Constitutional: Negative for fever, chills, activity change, appetite change and fatigue.  HENT: Positive for congestion, postnasal drip, rhinorrhea and sore throat. Negative for facial swelling.   Eyes: Negative.   Respiratory: Positive for cough. Negative for shortness of breath.   Cardiovascular: Negative.   Musculoskeletal: Negative for neck pain and neck stiffness.  Skin: Negative for pallor and rash.  Neurological: Negative.     Allergies  Review of patient's allergies indicates no known allergies.  Home Medications   Prior to Admission medications   Medication Sig Start Date End Date Taking? Authorizing Provider  guaiFENesin (ROBITUSSIN) 100 MG/5ML liquid Take 200 mg by mouth 3 (three) times daily as needed for cough.   Yes Historical Provider, MD  Multiple Vitamin (MULTIVITAMINS PO) Take by mouth daily.    Historical Provider, MD   Meds Ordered and Administered this Visit  Medications - No data to display  BP  116/74 mmHg  Pulse 94  Temp(Src) 98.6 F (37 C) (Oral)  Resp 18  SpO2 99%  LMP 11/13/2015 No data found.   Physical Exam  Constitutional: She is oriented to person, place, and time. She appears well-developed and well-nourished. No distress.  HENT:  Mouth/Throat: No oropharyngeal exudate.  Bilateral TMs are normal. Oropharynx with minor erythema, clear PND and light cobblestoning.  Eyes: Conjunctivae and EOM are normal.  Neck: Normal range of motion. Neck supple.  Cardiovascular: Normal rate, regular rhythm and normal heart sounds.   Pulmonary/Chest: Effort normal and breath sounds normal. No respiratory distress. She has no wheezes. She has no rales.  Musculoskeletal: Normal range of motion. She exhibits no edema.  Lymphadenopathy:    She has no cervical adenopathy.  Neurological: She is alert and oriented to person, place, and time.  Skin: Skin is warm and dry. No rash noted.  Psychiatric: She has a normal mood and affect.  Nursing note and vitals reviewed.   ED Course  Procedures (including critical care time)  Labs Review Labs Reviewed  POCT RAPID STREP A   Results for orders placed or performed during the hospital encounter of 03/08/16  POCT rapid strep A [facility])  Result Value Ref Range   Streptococcus, Group A Screen (Direct) NEGATIVE NEGATIVE     Imaging Review No results found.   Visual Acuity Review  Right Eye Distance:   Left Eye Distance:   Bilateral Distance:    Right Eye Near:   Left Eye  Near:    Bilateral Near:         MDM   1. Other seasonal allergic rhinitis   2. Allergic pharyngitis    Recommend taking an antihistamine such a Zyrtec, Claritin or Allegra for drainage. By minimizing the drainage this will help your throat feel better. He may use Cepacol lozenges. The sure to drink plenty of fluids and stay well-hydrated Use lots of saline nasal spray and frequently Consider using Flonase or Rhinocort nasal spray for the next  several days. Follow-up with your OB or PCP as needed.     Janne Napoleon, NP 03/08/16 1112  Janne Napoleon, NP 03/08/16 1125

## 2016-03-10 LAB — CULTURE, GROUP A STREP (THRC)

## 2016-05-11 LAB — OB RESULTS CONSOLE HEPATITIS B SURFACE ANTIGEN: HEP B S AG: NEGATIVE

## 2016-05-30 ENCOUNTER — Inpatient Hospital Stay (HOSPITAL_COMMUNITY)
Admission: AD | Admit: 2016-05-30 | Discharge: 2016-05-30 | Disposition: A | Discharge status: Home / Self Care | Payer: BLUE CROSS/BLUE SHIELD | Source: Ambulatory Visit | Attending: Obstetrics and Gynecology | Admitting: Obstetrics and Gynecology

## 2016-05-30 ENCOUNTER — Encounter (HOSPITAL_COMMUNITY): Payer: Self-pay | Admitting: *Deleted

## 2016-05-30 DIAGNOSIS — Z87891 Personal history of nicotine dependence: Secondary | ICD-10-CM | POA: Diagnosis not present

## 2016-05-30 DIAGNOSIS — O4702 False labor before 37 completed weeks of gestation, second trimester: Secondary | ICD-10-CM | POA: Diagnosis not present

## 2016-05-30 DIAGNOSIS — R1030 Lower abdominal pain, unspecified: Secondary | ICD-10-CM | POA: Diagnosis present

## 2016-05-30 DIAGNOSIS — Z3A28 28 weeks gestation of pregnancy: Secondary | ICD-10-CM | POA: Diagnosis not present

## 2016-05-30 LAB — URINE MICROSCOPIC-ADD ON

## 2016-05-30 LAB — URINALYSIS, ROUTINE W REFLEX MICROSCOPIC
BILIRUBIN URINE: NEGATIVE
Glucose, UA: NEGATIVE mg/dL
KETONES UR: NEGATIVE mg/dL
Nitrite: NEGATIVE
PH: 6 (ref 5.0–8.0)
Protein, ur: NEGATIVE mg/dL
SPECIFIC GRAVITY, URINE: 1.01 (ref 1.005–1.030)

## 2016-05-30 LAB — FETAL FIBRONECTIN: FETAL FIBRONECTIN: NEGATIVE

## 2016-05-30 MED ORDER — NIFEDIPINE 10 MG PO CAPS
10.0000 mg | ORAL_CAPSULE | ORAL | Status: DC | PRN
Start: 2016-05-30 — End: 2016-05-30
  Administered 2016-05-30 (×3): 10 mg via ORAL
  Filled 2016-05-30 (×3): qty 1

## 2016-05-30 MED ORDER — LACTATED RINGERS IV BOLUS (SEPSIS)
1000.0000 mL | Freq: Once | INTRAVENOUS | Status: AC
  Administered 2016-05-30: 1000 mL via INTRAVENOUS

## 2016-05-30 NOTE — MAU Note (Signed)
Contractions all day Sat. Ctx stronger and closer now. Denies LOF or bleeding.

## 2016-05-30 NOTE — MAU Provider Note (Signed)
Chief Complaint:  Contractions   First Provider Initiated Contact with Patient 05/30/16 [phone]     HPI: [name] is a 35 y.o. G3P1102 at [redacted]w[redacted]d who presents to maternity admissions reporting contractions every 3-4 minutes since yesterday morning 05/29/16.   Location: low abd  Quality: cramping Severity: mild Duration: 24 hours Context: none Timing: intermittent Course: worsening Modifying factors: No improvement w/ PO fluids and rest Associated signs and symptoms: Neg for fever, chills, vaginal bleeding, vaginal discharge, urinary complaints or GI complaints  Good fetal movement.   Pregnancy Course: Hx 35 week delivery.   Past Medical History: Past Medical History:  Diagnosis Date  . Pregnant     Past obstetric history: OB History  Gravida Para Term Preterm AB Living  3 2 1 1   2   SAB TAB Ectopic Multiple Live Births          2    # Outcome Date GA Lbr Len/2nd Weight Sex Delivery Anes PTL Lv  3 Current           2 Preterm  [redacted]w[redacted]d       LIV  1 Term         LIV      Past Surgical History: History reviewed. No pertinent surgical history.   Family History: History reviewed. No pertinent family history.  Social History: Social History  Substance Use Topics  . Smoking status: Former Research scientist (life sciences)  . Smokeless tobacco: Never Used  . Alcohol use No    Allergies: No Known Allergies  Meds:  Prescriptions Prior to Admission  Medication Sig Dispense Refill Last Dose  . Multiple Vitamin (MULTIVITAMINS PO) Take by mouth daily.   05/29/2016 at Unknown time  . guaiFENesin (ROBITUSSIN) 100 MG/5ML liquid Take 200 mg by mouth 3 (three) times daily as needed for cough.       I have reviewed patient's Past Medical Hx, Surgical Hx, Family Hx, Social Hx, medications and allergies.   ROS:  Review of Systems  Constitutional: Negative for chills and fever.  Gastrointestinal: Positive for abdominal pain. Negative for constipation, diarrhea, nausea and vomiting.  Genitourinary:  Negative for dysuria, hematuria, vaginal bleeding and vaginal discharge.    Physical Exam   Patient Vitals for the past 24 hrs:  BP Temp Pulse Resp Height Weight  05/30/16 0517 132/84 97.5 F (36.4 C) 102 18 5\' 6"  (1.676 m) 209 lb 9.6 oz (95.1 kg)   Constitutional: Well-developed, well-nourished female in no acute distress.  Cardiovascular: mild tachycardia Respiratory: normal effort GI: Abd soft, non-tender, gravid appropriate for gestational age.  MS: Extremities nontender, no edema, normal ROM Neurologic: Alert and oriented x 4.  GU:  Pelvic: NEFG, physiologic discharge, no blood, cervix clean. No CMT   Cervix long and closed, soft  FHT:  Baseline 145 , moderate variability, accelerations present, no decelerations Contractions: q 2-6 mins   Labs: Results for orders placed or performed during the hospital encounter of 05/30/16 (from the past 24 hour(s))  Urinalysis, Routine w reflex microscopic (not at [facility])     Status: Abnormal   Collection Time: 05/30/16  5:59 AM  Result Value Ref Range   Color, Urine YELLOW YELLOW   APPearance CLEAR CLEAR   Specific Gravity, Urine 1.010 1.005 - 1.030   pH 6.0 5.0 - 8.0   Glucose, UA NEGATIVE NEGATIVE mg/dL   Hgb urine dipstick MODERATE (A) NEGATIVE   Bilirubin Urine NEGATIVE NEGATIVE   Ketones, ur NEGATIVE NEGATIVE mg/dL   Protein, ur NEGATIVE NEGATIVE mg/dL  Nitrite NEGATIVE NEGATIVE   Leukocytes, UA TRACE (A) NEGATIVE  Fetal fibronectin     Status: None   Collection Time: 05/30/16  5:59 AM  Result Value Ref Range   Fetal Fibronectin NEGATIVE NEGATIVE  Urine microscopic-add on     Status: Abnormal   Collection Time: 05/30/16  5:59 AM  Result Value Ref Range   Squamous Epithelial / LPF 0-5 (A) NONE SEEN   WBC, UA 0-5 0 - 5 WBC/hpf   RBC / HPF 0-5 0 - 5 RBC/hpf   Bacteria, UA FEW (A) NONE SEEN    Imaging:  No results found.  MAU Course: Orders Placed This Encounter  Procedures  . Urinalysis, Routine w reflex microscopic  (not at [facility])  . Fetal fibronectin  . Urine microscopic-add on  . Insert peripheral IV  . Discharge patient   Meds ordered this encounter  Medications  . lactated ringers bolus 1,000 mL  . NIFEdipine (PROCARDIA) capsule 10 mg    MDM: - Preterm contractions w/out evidence of active PTL. Neg fFN.   Assessment: 1. Preterm uterine contractions in second trimester, antepartum    Plan: Discharge home in stable condition per Dr. Nelda Marseille.  Preterm labor precautions and fetal kick counts Follow-up Information    [facility] .   Specialty:  Obstetrics and Gynecology Why:  as scheduled or sooner as needed if Sx worsen. Contact information: [address] [place] [address] [phone]        [facility] .   Why:  as needed if symptoms worsen.  Contact information: [address] [id number] [place] [phone]            Medication List    STOP taking these medications   guaiFENesin 100 MG/5ML liquid Commonly known as:  ROBITUSSIN     TAKE these medications   MULTIVITAMINS PO Take by mouth daily.       [place], CNM 05/30/2016 7:12 AM

## 2016-05-30 NOTE — MAU Note (Signed)
Pt voided just before Triage. Understands to collect urine next time up to BR

## 2016-05-30 NOTE — Discharge Instructions (Signed)

## 2016-05-31 LAB — CULTURE, OB URINE
Culture: NO GROWTH
Special Requests: NORMAL

## 2016-07-22 ENCOUNTER — Inpatient Hospital Stay (HOSPITAL_COMMUNITY): Payer: BLUE CROSS/BLUE SHIELD | Admitting: Anesthesiology

## 2016-07-22 ENCOUNTER — Encounter (HOSPITAL_COMMUNITY)
Admission: AD | Disposition: A | Discharge status: Home / Self Care | Payer: Self-pay | Source: Ambulatory Visit | Attending: Obstetrics and Gynecology

## 2016-07-22 ENCOUNTER — Encounter (HOSPITAL_COMMUNITY): Payer: Self-pay | Admitting: *Deleted

## 2016-07-22 ENCOUNTER — Inpatient Hospital Stay (HOSPITAL_COMMUNITY)
Admission: AD | Admit: 2016-07-22 | Discharge: 2016-07-25 | DRG: 765 | Disposition: A | Discharge status: Home / Self Care | Payer: BLUE CROSS/BLUE SHIELD | Source: Ambulatory Visit | Attending: Obstetrics and Gynecology | Admitting: Obstetrics and Gynecology

## 2016-07-22 DIAGNOSIS — O9832 Other infections with a predominantly sexual mode of transmission complicating childbirth: Secondary | ICD-10-CM | POA: Diagnosis present

## 2016-07-22 DIAGNOSIS — A6000 Herpesviral infection of urogenital system, unspecified: Secondary | ICD-10-CM | POA: Diagnosis present

## 2016-07-22 DIAGNOSIS — Z87891 Personal history of nicotine dependence: Secondary | ICD-10-CM

## 2016-07-22 DIAGNOSIS — O3413 Maternal care for benign tumor of corpus uteri, third trimester: Secondary | ICD-10-CM | POA: Diagnosis present

## 2016-07-22 DIAGNOSIS — D259 Leiomyoma of uterus, unspecified: Secondary | ICD-10-CM | POA: Diagnosis present

## 2016-07-22 DIAGNOSIS — Z3A36 36 weeks gestation of pregnancy: Secondary | ICD-10-CM

## 2016-07-22 DIAGNOSIS — O4593 Premature separation of placenta, unspecified, third trimester: Secondary | ICD-10-CM | POA: Diagnosis present

## 2016-07-22 DIAGNOSIS — O42013 Preterm premature rupture of membranes, onset of labor within 24 hours of rupture, third trimester: Secondary | ICD-10-CM | POA: Diagnosis present

## 2016-07-22 LAB — CBC
HEMATOCRIT: 36.5 % (ref 36.0–46.0)
HEMOGLOBIN: 12.1 g/dL (ref 12.0–15.0)
MCH: 27.4 pg (ref 26.0–34.0)
MCHC: 33.2 g/dL (ref 30.0–36.0)
MCV: 82.6 fL (ref 78.0–100.0)
Platelets: 133 10*3/uL — ABNORMAL LOW (ref 150–400)
RBC: 4.42 MIL/uL (ref 3.87–5.11)
RDW: 15.4 % (ref 11.5–15.5)
WBC: 9.2 10*3/uL (ref 4.0–10.5)

## 2016-07-22 LAB — GROUP B STREP BY PCR: GROUP B STREP BY PCR: NEGATIVE

## 2016-07-22 LAB — PREPARE RBC (CROSSMATCH)

## 2016-07-22 LAB — OB RESULTS CONSOLE GBS: GBS: NEGATIVE

## 2016-07-22 LAB — ABO/RH: ABO/RH(D): O POS

## 2016-07-22 LAB — POCT FERN TEST: POCT FERN TEST: POSITIVE

## 2016-07-22 SURGERY — Surgical Case
Anesthesia: Epidural

## 2016-07-22 MED ORDER — DEXAMETHASONE SODIUM PHOSPHATE 4 MG/ML IJ SOLN
INTRAMUSCULAR | Status: AC
  Filled 2016-07-22: qty 1

## 2016-07-22 MED ORDER — PHENYLEPHRINE 40 MCG/ML (10ML) SYRINGE FOR IV PUSH (FOR BLOOD PRESSURE SUPPORT): 80.0000 ug | PREFILLED_SYRINGE | INTRAVENOUS | Status: DC | PRN

## 2016-07-22 MED ORDER — DEXAMETHASONE SODIUM PHOSPHATE 4 MG/ML IJ SOLN
INTRAMUSCULAR | Status: DC | PRN
  Administered 2016-07-22: 4 mg via INTRAVENOUS

## 2016-07-22 MED ORDER — SOD CITRATE-CITRIC ACID 500-334 MG/5ML PO SOLN
30.0000 mL | ORAL | Status: DC | PRN
  Administered 2016-07-22: 30 mL via ORAL
  Filled 2016-07-22: qty 15

## 2016-07-22 MED ORDER — METHYLERGONOVINE MALEATE 0.2 MG/ML IJ SOLN
INTRAMUSCULAR | Status: AC
  Filled 2016-07-22: qty 1

## 2016-07-22 MED ORDER — ACETAMINOPHEN 500 MG PO TABS: 1000.0000 mg | ORAL_TABLET | Freq: Four times a day (QID) | ORAL | Status: DC

## 2016-07-22 MED ORDER — BETAMETHASONE SOD PHOS & ACET 6 (3-3) MG/ML IJ SUSP
12.0000 mg | Freq: Once | INTRAMUSCULAR | Status: DC
  Filled 2016-07-22: qty 2

## 2016-07-22 MED ORDER — FENTANYL CITRATE (PF) 100 MCG/2ML IJ SOLN: 25.0000 ug | INTRAMUSCULAR | Status: DC | PRN

## 2016-07-22 MED ORDER — MEPERIDINE HCL 25 MG/ML IJ SOLN: 6.2500 mg | INTRAMUSCULAR | Status: DC | PRN

## 2016-07-22 MED ORDER — TERBUTALINE SULFATE 1 MG/ML IJ SOLN: 0.2500 mg | Freq: Once | INTRAMUSCULAR | Status: DC | PRN

## 2016-07-22 MED ORDER — LACTATED RINGERS IV SOLN: 500.0000 mL | Freq: Once | INTRAVENOUS | Status: DC

## 2016-07-22 MED ORDER — DIPHENHYDRAMINE HCL 50 MG/ML IJ SOLN
INTRAMUSCULAR | Status: AC
  Filled 2016-07-22: qty 1

## 2016-07-22 MED ORDER — ONDANSETRON HCL 4 MG/2ML IJ SOLN
INTRAMUSCULAR | Status: AC
  Filled 2016-07-22: qty 2

## 2016-07-22 MED ORDER — DEXTROSE 5 % IV SOLN
5.0000 10*6.[IU] | Freq: Once | INTRAVENOUS | Status: DC
  Filled 2016-07-22: qty 5

## 2016-07-22 MED ORDER — DIPHENHYDRAMINE HCL 50 MG/ML IJ SOLN
INTRAMUSCULAR | Status: DC | PRN
  Administered 2016-07-22: 25 mg via INTRAVENOUS

## 2016-07-22 MED ORDER — MIDAZOLAM HCL 2 MG/2ML IJ SOLN
INTRAMUSCULAR | Status: AC
  Filled 2016-07-22: qty 2

## 2016-07-22 MED ORDER — OXYTOCIN 40 UNITS IN LACTATED RINGERS INFUSION - SIMPLE MED
2.5000 [IU]/h | INTRAVENOUS | Status: DC
  Filled 2016-07-22: qty 1000

## 2016-07-22 MED ORDER — PROMETHAZINE HCL 25 MG/ML IJ SOLN: 6.2500 mg | INTRAMUSCULAR | Status: DC | PRN

## 2016-07-22 MED ORDER — NALOXONE HCL 0.4 MG/ML IJ SOLN: 0.4000 mg | INTRAMUSCULAR | Status: DC | PRN

## 2016-07-22 MED ORDER — BUTORPHANOL TARTRATE 1 MG/ML IJ SOLN: 1.0000 mg | INTRAMUSCULAR | Status: DC | PRN

## 2016-07-22 MED ORDER — MORPHINE SULFATE (PF) 0.5 MG/ML IJ SOLN
INTRAMUSCULAR | Status: DC | PRN
  Administered 2016-07-22: 1 mg via INTRAVENOUS
  Administered 2016-07-22: 4 mg via EPIDURAL

## 2016-07-22 MED ORDER — PHENYLEPHRINE 40 MCG/ML (10ML) SYRINGE FOR IV PUSH (FOR BLOOD PRESSURE SUPPORT)
PREFILLED_SYRINGE | INTRAVENOUS | Status: AC
  Filled 2016-07-22: qty 10

## 2016-07-22 MED ORDER — MISOPROSTOL 200 MCG PO TABS
ORAL_TABLET | ORAL | Status: AC
  Filled 2016-07-22: qty 5

## 2016-07-22 MED ORDER — CEFAZOLIN SODIUM-DEXTROSE 2-4 GM/100ML-% IV SOLN: 2.0000 g | Freq: Once | INTRAVENOUS | Status: DC

## 2016-07-22 MED ORDER — BETAMETHASONE SOD PHOS & ACET 6 (3-3) MG/ML IJ SUSP
12.0000 mg | Freq: Once | INTRAMUSCULAR | Status: AC
  Administered 2016-07-22: 12 mg via INTRAMUSCULAR
  Filled 2016-07-22: qty 2

## 2016-07-22 MED ORDER — SODIUM BICARBONATE 8.4 % IV SOLN
INTRAVENOUS | Status: AC
Start: 2016-07-22 — End: 2016-07-22
  Filled 2016-07-22: qty 50

## 2016-07-22 MED ORDER — MORPHINE SULFATE (PF) 0.5 MG/ML IJ SOLN
INTRAMUSCULAR | Status: AC
  Filled 2016-07-22: qty 10

## 2016-07-22 MED ORDER — DIPHENHYDRAMINE HCL 25 MG PO CAPS
25.0000 mg | ORAL_CAPSULE | ORAL | Status: DC | PRN
  Filled 2016-07-22: qty 1

## 2016-07-22 MED ORDER — MIDAZOLAM HCL 2 MG/2ML IJ SOLN
INTRAMUSCULAR | Status: DC | PRN
  Administered 2016-07-22: 2 mg via INTRAVENOUS

## 2016-07-22 MED ORDER — PHENYLEPHRINE HCL 10 MG/ML IJ SOLN
INTRAMUSCULAR | Status: DC | PRN
  Administered 2016-07-22 (×2): 80 ug via INTRAVENOUS

## 2016-07-22 MED ORDER — SODIUM CHLORIDE 0.9 % IV SOLN: Freq: Once | INTRAVENOUS | Status: DC

## 2016-07-22 MED ORDER — FLEET ENEMA 7-19 GM/118ML RE ENEM: 1.0000 | ENEMA | RECTAL | Status: DC | PRN

## 2016-07-22 MED ORDER — NALBUPHINE HCL 10 MG/ML IJ SOLN: 5.0000 mg | INTRAMUSCULAR | Status: DC | PRN

## 2016-07-22 MED ORDER — NALOXONE HCL 2 MG/2ML IJ SOSY
1.0000 ug/kg/h | PREFILLED_SYRINGE | INTRAVENOUS | Status: DC | PRN
  Filled 2016-07-22: qty 2

## 2016-07-22 MED ORDER — NALBUPHINE HCL 10 MG/ML IJ SOLN: 5.0000 mg | Freq: Once | INTRAMUSCULAR | Status: DC | PRN

## 2016-07-22 MED ORDER — PHENYLEPHRINE 40 MCG/ML (10ML) SYRINGE FOR IV PUSH (FOR BLOOD PRESSURE SUPPORT)
80.0000 ug | PREFILLED_SYRINGE | INTRAVENOUS | Status: DC | PRN
  Filled 2016-07-22: qty 10

## 2016-07-22 MED ORDER — OXYTOCIN BOLUS FROM INFUSION: 500.0000 mL | Freq: Once | INTRAVENOUS | Status: DC

## 2016-07-22 MED ORDER — LIDOCAINE HCL (PF) 1 % IJ SOLN: 30.0000 mL | INTRAMUSCULAR | Status: DC | PRN

## 2016-07-22 MED ORDER — EPHEDRINE 5 MG/ML INJ: 10.0000 mg | INTRAVENOUS | Status: DC | PRN

## 2016-07-22 MED ORDER — LIDOCAINE HCL (PF) 1 % IJ SOLN
INTRAMUSCULAR | Status: DC | PRN
  Administered 2016-07-22: 6 mL via EPIDURAL
  Administered 2016-07-22: 4 mL

## 2016-07-22 MED ORDER — SODIUM BICARBONATE 8.4 % IV SOLN
INTRAVENOUS | Status: DC | PRN
  Administered 2016-07-22 (×4): 5 mL via EPIDURAL

## 2016-07-22 MED ORDER — LACTATED RINGERS IV SOLN
INTRAVENOUS | Status: DC
  Administered 2016-07-22 (×5): via INTRAVENOUS

## 2016-07-22 MED ORDER — LACTATED RINGERS IV SOLN
INTRAVENOUS | Status: DC | PRN
  Administered 2016-07-22: 40 [IU] via INTRAVENOUS

## 2016-07-22 MED ORDER — BUPIVACAINE HCL (PF) 0.25 % IJ SOLN
INTRAMUSCULAR | Status: AC
  Filled 2016-07-22: qty 10

## 2016-07-22 MED ORDER — CEFAZOLIN SODIUM-DEXTROSE 2-4 GM/100ML-% IV SOLN
INTRAVENOUS | Status: AC
  Filled 2016-07-22: qty 100

## 2016-07-22 MED ORDER — DIPHENHYDRAMINE HCL 50 MG/ML IJ SOLN: 12.5000 mg | INTRAMUSCULAR | Status: DC | PRN

## 2016-07-22 MED ORDER — ONDANSETRON HCL 4 MG/2ML IJ SOLN
INTRAMUSCULAR | Status: DC | PRN
  Administered 2016-07-22: 4 mg via INTRAVENOUS

## 2016-07-22 MED ORDER — ONDANSETRON HCL 4 MG/2ML IJ SOLN: 4.0000 mg | Freq: Three times a day (TID) | INTRAMUSCULAR | Status: DC | PRN

## 2016-07-22 MED ORDER — CEFAZOLIN SODIUM-DEXTROSE 2-3 GM-% IV SOLR
INTRAVENOUS | Status: DC | PRN
  Administered 2016-07-22: 2 g via INTRAVENOUS

## 2016-07-22 MED ORDER — LACTATED RINGERS IV SOLN: 500.0000 mL | INTRAVENOUS | Status: DC | PRN

## 2016-07-22 MED ORDER — OXYTOCIN 40 UNITS IN LACTATED RINGERS INFUSION - SIMPLE MED
1.0000 m[IU]/min | INTRAVENOUS | Status: DC
  Administered 2016-07-22: 2 m[IU]/min via INTRAVENOUS
  Administered 2016-07-22: 6 m[IU]/min via INTRAVENOUS

## 2016-07-22 MED ORDER — SCOPOLAMINE 1 MG/3DAYS TD PT72
MEDICATED_PATCH | TRANSDERMAL | Status: DC | PRN
  Administered 2016-07-22: 1 via TRANSDERMAL

## 2016-07-22 MED ORDER — ONDANSETRON HCL 4 MG/2ML IJ SOLN
4.0000 mg | Freq: Four times a day (QID) | INTRAMUSCULAR | Status: DC | PRN
  Administered 2016-07-22: 4 mg via INTRAVENOUS
  Filled 2016-07-22: qty 2

## 2016-07-22 MED ORDER — SCOPOLAMINE 1 MG/3DAYS TD PT72
MEDICATED_PATCH | TRANSDERMAL | Status: AC
  Filled 2016-07-22: qty 1

## 2016-07-22 MED ORDER — LACTATED RINGERS IV SOLN
INTRAVENOUS | Status: DC | PRN
  Administered 2016-07-22: 21:00:00 via INTRAVENOUS

## 2016-07-22 MED ORDER — BUPIVACAINE HCL (PF) 0.25 % IJ SOLN
INTRAMUSCULAR | Status: DC | PRN
  Administered 2016-07-22: 10 mL

## 2016-07-22 MED ORDER — PENICILLIN G POT IN DEXTROSE 60000 UNIT/ML IV SOLN
3.0000 10*6.[IU] | INTRAVENOUS | Status: DC
  Filled 2016-07-22: qty 50

## 2016-07-22 MED ORDER — OXYTOCIN 10 UNIT/ML IJ SOLN
INTRAMUSCULAR | Status: AC
  Filled 2016-07-22: qty 4

## 2016-07-22 MED ORDER — SODIUM CHLORIDE 0.9% FLUSH: 3.0000 mL | INTRAVENOUS | Status: DC | PRN

## 2016-07-22 MED ORDER — FENTANYL 2.5 MCG/ML BUPIVACAINE 1/10 % EPIDURAL INFUSION (WH - ANES)
14.0000 mL/h | INTRAMUSCULAR | Status: DC | PRN
  Administered 2016-07-22 (×2): 14 mL/h via EPIDURAL
  Filled 2016-07-22: qty 100

## 2016-07-22 MED ORDER — ACETAMINOPHEN 325 MG PO TABS: 650.0000 mg | ORAL_TABLET | ORAL | Status: DC | PRN

## 2016-07-22 MED ORDER — OXYCODONE-ACETAMINOPHEN 5-325 MG PO TABS: 2.0000 | ORAL_TABLET | ORAL | Status: DC | PRN

## 2016-07-22 MED ORDER — LIDOCAINE-EPINEPHRINE (PF) 2 %-1:200000 IJ SOLN
INTRAMUSCULAR | Status: AC
  Filled 2016-07-22: qty 20

## 2016-07-22 MED ORDER — OXYCODONE-ACETAMINOPHEN 5-325 MG PO TABS: 1.0000 | ORAL_TABLET | ORAL | Status: DC | PRN

## 2016-07-22 MED ORDER — FENTANYL CITRATE (PF) 100 MCG/2ML IJ SOLN
INTRAMUSCULAR | Status: AC
  Filled 2016-07-22: qty 2

## 2016-07-22 MED ORDER — FENTANYL CITRATE (PF) 100 MCG/2ML IJ SOLN
INTRAMUSCULAR | Status: DC | PRN
  Administered 2016-07-22: 100 ug via EPIDURAL

## 2016-07-22 MED ORDER — METHYLERGONOVINE MALEATE 0.2 MG/ML IJ SOLN
INTRAMUSCULAR | Status: DC | PRN
  Administered 2016-07-22: 0.2 mg via INTRAMUSCULAR

## 2016-07-22 SURGICAL SUPPLY — 43 items
APL SKNCLS STERI-STRIP NONHPOA (GAUZE/BANDAGES/DRESSINGS) ×1
BARRIER ADHS 3X4 INTERCEED (GAUZE/BANDAGES/DRESSINGS) ×2 IMPLANT
BENZOIN TINCTURE PRP APPL 2/3 (GAUZE/BANDAGES/DRESSINGS) ×3 IMPLANT
BRR ADH 4X3 ABS CNTRL BYND (GAUZE/BANDAGES/DRESSINGS) ×1
CHLORAPREP W/TINT 26ML (MISCELLANEOUS) ×3 IMPLANT
CLAMP CORD UMBIL (MISCELLANEOUS) IMPLANT
CLOSURE WOUND 1/2 X4 (GAUZE/BANDAGES/DRESSINGS) ×1
CLOTH BEACON ORANGE TIMEOUT ST (SAFETY) ×3 IMPLANT
CONTAINER PREFILL 10% NBF 15ML (MISCELLANEOUS) IMPLANT
DRSG OPSITE POSTOP 4X10 (GAUZE/BANDAGES/DRESSINGS) ×3 IMPLANT
ELECT REM PT RETURN 9FT ADLT (ELECTROSURGICAL) ×3
ELECTRODE REM PT RTRN 9FT ADLT (ELECTROSURGICAL) ×1 IMPLANT
EXTRACTOR VACUUM KIWI (MISCELLANEOUS) ×2 IMPLANT
GAUZE SPONGE 4X4 12PLY STRL (GAUZE/BANDAGES/DRESSINGS) ×8 IMPLANT
GLOVE BIOGEL M 6.5 STRL (GLOVE) ×6 IMPLANT
GLOVE BIOGEL PI IND STRL 6.5 (GLOVE) ×1 IMPLANT
GLOVE BIOGEL PI IND STRL 7.0 (GLOVE) ×1 IMPLANT
GLOVE BIOGEL PI INDICATOR 6.5 (GLOVE) ×4
GLOVE BIOGEL PI INDICATOR 7.0 (GLOVE) ×4
GOWN STRL REUS W/TWL LRG LVL3 (GOWN DISPOSABLE) ×11 IMPLANT
HEMOSTAT ARISTA ABSORB 3G PWDR (MISCELLANEOUS) ×4 IMPLANT
KIT ABG SYR 3ML LUER SLIP (SYRINGE) ×2 IMPLANT
NDL HYPO 25X5/8 SAFETYGLIDE (NEEDLE) IMPLANT
NEEDLE HYPO 25X5/8 SAFETYGLIDE (NEEDLE) ×3 IMPLANT
NS IRRIG 1000ML POUR BTL (IV SOLUTION) ×3 IMPLANT
PACK C SECTION WH (CUSTOM PROCEDURE TRAY) ×3 IMPLANT
PAD ABD 7.5X8 STRL (GAUZE/BANDAGES/DRESSINGS) ×2 IMPLANT
PAD ABD 8X7 1/2 STERILE (GAUZE/BANDAGES/DRESSINGS) ×2 IMPLANT
PAD OB MATERNITY 4.3X12.25 (PERSONAL CARE ITEMS) ×3 IMPLANT
PENCIL SMOKE EVAC W/HOLSTER (ELECTROSURGICAL) ×3 IMPLANT
RTRCTR C-SECT PINK 25CM LRG (MISCELLANEOUS) ×2 IMPLANT
STRIP CLOSURE SKIN 1/2X4 (GAUZE/BANDAGES/DRESSINGS) ×2 IMPLANT
SUT PDS AB 0 CT1 27 (SUTURE) ×6 IMPLANT
SUT PLAIN 0 NONE (SUTURE) IMPLANT
SUT VIC AB 0 CTX 36 (SUTURE) ×18
SUT VIC AB 0 CTX36XBRD ANBCTRL (SUTURE) ×3 IMPLANT
SUT VIC AB 2-0 CT1 27 (SUTURE) ×3
SUT VIC AB 2-0 CT1 TAPERPNT 27 (SUTURE) ×1 IMPLANT
SUT VIC AB 3-0 SH 27 (SUTURE)
SUT VIC AB 3-0 SH 27X BRD (SUTURE) IMPLANT
SUT VIC AB 4-0 KS 27 (SUTURE) ×3 IMPLANT
TOWEL OR 17X24 6PK STRL BLUE (TOWEL DISPOSABLE) ×3 IMPLANT
TRAY FOLEY CATH SILVER 14FR (SET/KITS/TRAYS/PACK) ×1 IMPLANT

## 2016-07-22 NOTE — Progress Notes (Signed)
[name] is a 35 y.o. [id number] at [redacted]w[redacted]d by LMP admitted for PROM  Subjective:  Called to see pt due to increased  Vaginal bleeding. Pt is comfortable with epidural she is feeling more pressure.   Objective: BP 140/65   Pulse 100   Temp 98.4 F (36.9 C) (Oral)   Resp 20   Ht 5\' 6"  (1.676 m)   Wt 98.9 kg (218 lb)   LMP 11/13/2015   SpO2 98%   BMI 35.19 kg/m  I/O last 3 completed shifts: In: -  Out: 350 [Urine:350] No intake/output data recorded.  FHT:  FHR: 120 bpm, variability: moderate,  accelerations:  Present,  decelerations:  Present variable  UC:   regular, every 2 minutes SVE:   Dilation: 8 Effacement (%): 90 Station: -1 Exam by:: Dr.Romir Klimowicz  Labs: Lab Results  Component Value Date   WBC 9.2 07/22/2016   HGB 12.1 07/22/2016   HCT 36.5 07/22/2016   MCV 82.6 07/22/2016   PLT 133 (L) 07/22/2016    Assessment / Plan: Induction of labor due to PROM,  progressing well on pitocin Increased vaginal bleeding moderate on exam .Currently  fhr is reassuring and bleeding is not heavy  I discussed with the patient cesarean section in the event that bleeding becomes heavy or fhr is nonreassuring. Will monitor closely. Labor: progressing on pitocin  Preeclampsia:  NA Fetal Wellbeing:  Category I over all but intermettent category II Pain Control:  Epidural I/D:  n/a   Llesenia Fogal J. 07/22/2016, 7:39 PM

## 2016-07-22 NOTE — MAU Note (Signed)
Pt reports she felt "wet" during the night. When she got up she sarted leaking clear fluid. Denies any pan or cramping.

## 2016-07-22 NOTE — Transfer of Care (Signed)
Immediate Anesthesia Transfer of Care Note  Patient: [name]  Procedure(s) Performed: Procedure(s): CESAREAN SECTION (N/A)  Patient Location: PACU  Anesthesia Type:Epidural  Level of Consciousness: awake, alert , oriented and patient cooperative  Airway & Oxygen Therapy: Patient Spontanous Breathing  Post-op Assessment: Report given to RN and Post -op Vital signs reviewed and stable  Post vital signs: Reviewed and stable  Last Vitals:  Vitals:   07/22/16 1926 07/22/16 1933  BP:  140/65  Pulse:  100  Resp:  20  Temp: 36.9 C     Last Pain:  Vitals:   07/22/16 1933  TempSrc:   PainSc: 3       Patients Stated Pain Goal: 5 ([id number])  Complications: No apparent anesthesia complications

## 2016-07-22 NOTE — Progress Notes (Signed)
Jazsmin Niezgoda is a 35 y.o. G3P1102 at [redacted]w[redacted]d by LMP admitted for PROM  Subjective: Patient is comfortable with her epidural   Objective: BP 129/77   Pulse 93   Temp 97.9 F (36.6 C) (Oral)   Resp 20   Ht 5\' 6"  (1.676 m)   Wt 98.9 kg (218 lb)   LMP 11/13/2015   SpO2 98%   BMI 35.19 kg/m  No intake/output data recorded. Total I/O In: -  Out: 350 [Urine:350]  FHT:  FHR: 120 bpm, variability: moderate,  accelerations:  Present,  decelerations:  Absent UC:   regular, every 2 minutes SVE:   Dilation: 6 Effacement (%): 80 Station: -2 Exam by:: Antoni Stefan IUPC placed .Marland Kitchen Bloody show noted  Labs: Lab Results  Component Value Date   WBC 9.2 07/22/2016   HGB 12.1 07/22/2016   HCT 36.5 07/22/2016   MCV 82.6 07/22/2016   PLT 133 (L) 07/22/2016    Assessment / Plan: Induction of labor due to PROM,  progressing well on pitocin  Labor: continue pitocin  Preeclampsia:  NA Fetal Wellbeing:  Category I Pain Control:  Epidural I/D:  n/a Anticipated MOD:  NSVD  Jesiah Yerby J. 07/22/2016, 6:19 PM

## 2016-07-22 NOTE — Anesthesia Procedure Notes (Signed)

## 2016-07-22 NOTE — Progress Notes (Signed)
Hadiya Haltiwanger is a 35 y.o. [id number] at [redacted]w[redacted]d by LMP admitted for PROM  Subjective: Called to see patient due heavier vaginal bleeding. Pt feeling more pressure.   Objective: BP 140/65   Pulse 100   Temp 98.4 F (36.9 C) (Oral)   Resp 20   Ht 5\' 6"  (1.676 m)   Wt 98.9 kg (218 lb)   LMP 11/13/2015   SpO2 98%   BMI 35.19 kg/m  I/O last 3 completed shifts: In: -  Out: 350 [Urine:350] No intake/output data recorded.  FHT:  FHR: 120 bpm, variability: moderate,  accelerations:  Present,  decelerations:  Present variable  UC:   regular, every 2 minutes SVE:   Dilation: 8 Effacement (%): 90 Station: -1 Exam by:: Dr.Vaniya Augspurger On exam. 250 cc large clot seen on towel. Moderate bleeding  Labs: Lab Results  Component Value Date   WBC 9.2 07/22/2016   HGB 12.1 07/22/2016   HCT 36.5 07/22/2016   MCV 82.6 07/22/2016   PLT 133 (L) 07/22/2016    Assessment / Plan: Heavy uterine bleeding. Possible abruption. Recommend cesarean section. R/B/A of cesarean section discussed with the patient including but not limited to infection/ bleeding damage to bowel bladder baby with the need for further surgery. R/o tranfusion discussed pt voiced understanding and desires to proceed.  Darden Flemister J. 07/22/2016, 8:21 PM

## 2016-07-22 NOTE — MAU Provider Note (Signed)
S: [name] is a 35 y.o. [id number] at [redacted]w[redacted]d who presents today with leaking of fluid. She denies any VB. She confirms fetal movement. O: VSS, afebrile Abdomen: soft, non-tender, gravid External: no lesion Vagina: small amount of clear, watery discharge seen, but no pooling  Cervix: pink, smooth Uterus: AGA FHT: 150, moderate with 15x15 accels, no decels Toco: about every 6-8 mins No results found for this or any previous visit (from the past 24 hour(s)). A/P: Exam for ROM RN will report to attending MD

## 2016-07-22 NOTE — Op Note (Signed)
Cesarean Section Procedure Note  Indications: 35 y/o at 36 wks 0 days EGA admitted for PPROM at 530 am. Pitocin started for Induction of labor. Pt developed heavy vaginal bleeding. Cesarean section recommended due to suspected placental Abruption.   Pre-operative Diagnosis: 36 week 0 day pregnancy. 2) Suspected placental Abruption. 3) Vaginal Bleeding   Post-operative Diagnosis: Placental abruption  Surgeon: Catha Brow.   Assistants: None  Anesthesia: Epidural anesthesia  ASA Class: 2   Procedure Details   The patient was seen in the Holding Room. The risks, benefits, complications, treatment options, and expected outcomes were discussed with the patient.  The patient concurred with the proposed plan, giving informed consent.  The site of surgery properly noted/marked. The patient was taken to Operating Room # 9, identified as Paeyton Speese and the procedure verified as C-Section Delivery. A Time Out was held and the above information confirmed.  After induction of anesthesia, the patient was draped and prepped in the usual sterile manner. A Pfannenstiel incision was made and carried down through the subcutaneous tissue to the fascia. Fascial incision was made and extended transversely. The fascia was separated from the underlying rectus tissue superiorly and inferiorly. The peritoneum was identified and entered. Peritoneal incision was extended longitudinally. The utero-vesical peritoneal reflection was incised transversely and the bladder flap was bluntly freed from the lower uterine segment. A low transverse uterine incision was made. Delivered from cephalic presentation was a   Female with Apgar scores of 7 at one minute and 10 at five minutes. After the umbilical cord was clamped and cut cord blood was obtained for evaluation. The placenta was removed intact a 20 % area of abruption was noted.   Pt was noted to have uterine atony. This was controlled with pitocin IV and 0.2 mg of methergine  IM. The uterine outline, tubes and ovaries appeared normal. The uterine incision was closed with running locked sutures of O vicryl. A second layer of 0 vicryl was used to imbricate the incision Pt was noted to have an extension of the uterine incision on the right side. This was re-approximated with 0 vicryl. Marland Kitchen .Lavage was carried out until clear. Slight bleeding was noted from the bladder flap. Hemostasis was obtained with Arista. Inter-seed was placed over the uterine incision.  The fascia was then reapproximated with running sutures of 0 pds. The skin was reapproximated with 4-0 vicryl .  Instrument, sponge, and needle counts were correct prior the abdominal closure and at the conclusion of the case.   Findings: Female infant int he cephalic presentation. 20 % area of placental abruption. Normal fallopian tubes and ovaries.   Estimated Blood Loss:  1250 cc         Drains: None         Total IV Fluids:  Per anesthesia ml         Specimens: Placenta and Disposition:  Sent to Pathology          Implants: None         Complications:  None; patient tolerated the procedure well.         Disposition: PACU - hemodynamically stable.         Condition: stable  Attending Attestation: I performed the procedure.

## 2016-07-22 NOTE — Anesthesia Postprocedure Evaluation (Signed)
Anesthesia Post Note  Patient: [name]  Procedure(s) Performed: Procedure(s) (LRB): CESAREAN SECTION (N/A)  Patient location during evaluation: PACU Anesthesia Type: Epidural Level of consciousness: awake, awake and alert and oriented Pain management: pain level controlled Vital Signs Assessment: post-procedure vital signs reviewed and stable Respiratory status: spontaneous breathing, nonlabored ventilation and respiratory function stable Cardiovascular status: stable Postop Assessment: no headache, no backache, epidural receding, patient able to bend at knees and no signs of nausea or vomiting Anesthetic complications: no     Last Vitals:  Vitals:   07/22/16 2230 07/22/16 2300  BP: 126/72   Pulse: 87   Resp: 17   Temp: 37 C 37.1 C    Last Pain:  Vitals:   07/22/16 2300  TempSrc: Oral  PainSc:    Pain Goal: Patients Stated Pain Goal: 5 (07/22/16 1200)               Catalina Gravel

## 2016-07-22 NOTE — Anesthesia Preprocedure Evaluation (Signed)

## 2016-07-22 NOTE — H&P (Signed)
[name] is a 35 y.o. female G3P1102 at 36 wks and 0 days by LMP of 11/13/2015 confirmed by 6 wk u/s with EDD 08/19/2016 presents complaining of PPROM at 530 am clear fluid. Irregular contractions. Fern positive in MAU. 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 Pt has been on valtrex for suppression.  She also has uterine fibroids the largest is 4.2 cm.  Her prenatal care is provided by Dr. Christophe Louis with [facility].    OB History    Gravida Para Term Preterm AB Living   3 2 1 1   2    SAB TAB Ectopic Multiple Live Births           2     Past Medical History:  Diagnosis Date  . Pregnant    PGYN hx:  History reviewed. No pertinent surgical history. Family History: family history is not on file. Social History:  reports that she has quit smoking. She has never used smokeless tobacco. She reports that she does not drink alcohol or use drugs.     Maternal Diabetes: No Genetic Screening: Normal Maternal Ultrasounds/Referrals: Normal Fetal Ultrasounds or other Referrals:  None Maternal Substance Abuse:  No Significant Maternal Medications:  None Significant Maternal Lab Results:  Lab values include: Group B Strep negative Other Comments:  None  Review of Systems  Constitutional: Negative.   HENT: Negative.   Eyes: Negative.   Respiratory: Negative.   Cardiovascular: Negative.   Gastrointestinal: Negative.   Genitourinary: Negative.   Musculoskeletal: Negative.   Skin: Negative.   Neurological: Negative.   Endo/Heme/Allergies: Negative.   Psychiatric/Behavioral: Negative.    Maternal Medical History:  Reason for admission: Rupture of membranes.   Contractions: Onset was 6-12 hours ago.   Frequency: irregular.   Perceived severity is mild.    Fetal activity: Perceived fetal activity is normal.   Last perceived fetal movement was within the past hour.    Prenatal Complications - Diabetes: none.    Dilation: 2.5 Effacement (%): 50 Station:  -2 Exam by:: Landry Mellow MD Blood pressure 118/66, pulse (!) 110, temperature 98.1 F (36.7 C), temperature source Oral, resp. rate 18, height 5\' 6"  (1.676 m), weight 98.9 kg (218 lb), last menstrual period 11/13/2015, SpO2 98 %. Maternal Exam:  Abdomen: Patient reports no abdominal tenderness. Estimated fetal weight is 7 lbs 12 oz on ultrasound 07/21/2016.   Fetal presentation: vertex  Introitus: Normal vulva. Normal vagina.  Ferning test: positive.  Amniotic fluid character: clear.     Fetal Exam Fetal Monitor Review: Baseline rate: 120.  Variability: moderate (6-25 bpm).   Pattern: accelerations present and no decelerations.    Fetal State Assessment: Category I - tracings are normal.     Physical Exam  Vitals reviewed. Constitutional: She is oriented to person, place, and time. She appears well-developed and well-nourished.  HENT:  Head: Normocephalic and atraumatic.  Eyes: Pupils are equal, round, and reactive to light.  Neck: Normal range of motion. Neck supple.  Cardiovascular: Normal rate and regular rhythm.   Respiratory: Effort normal and breath sounds normal.  GI: There is no tenderness.  Genitourinary: Vagina normal.  Musculoskeletal: She exhibits no edema.  Neurological: She is alert and oriented to person, place, and time.  Skin: Skin is warm and dry.  Psychiatric: She has a normal mood and affect.    Prenatal labs: ABO, Rh: --/--/O POS, O POS (11/30 0900) Antibody: NEG (11/30 0900) Rubella:  Immune  RPR: Non Reactive (05/14 2332)  HBsAg:  Negative (09/19 0000)  HIV: Non Reactive (05/14 2332)  GBS: Negative (11/30 0000)   Assessment/Plan: 36 wks and 0 days with PPROM. BMZ for fetal lung maturity.  Pitocin for induction of labor.  Anticipate SVD.  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 07/22/2016, 1:46 PM

## 2016-07-23 ENCOUNTER — Encounter (HOSPITAL_COMMUNITY): Payer: Self-pay | Admitting: Emergency Medicine

## 2016-07-23 LAB — CBC
HCT: 31.5 % — ABNORMAL LOW (ref 36.0–46.0)
Hemoglobin: 10.7 g/dL — ABNORMAL LOW (ref 12.0–15.0)
MCH: 27.8 pg (ref 26.0–34.0)
MCHC: 34 g/dL (ref 30.0–36.0)
MCV: 81.8 fL (ref 78.0–100.0)
PLATELETS: 162 10*3/uL (ref 150–400)
RBC: 3.85 MIL/uL — AB (ref 3.87–5.11)
RDW: 15.4 % (ref 11.5–15.5)
WBC: 17.3 10*3/uL — AB (ref 4.0–10.5)

## 2016-07-23 LAB — RPR: RPR Ser Ql: NONREACTIVE

## 2016-07-23 MED ORDER — OXYCODONE-ACETAMINOPHEN 5-325 MG PO TABS
2.0000 | ORAL_TABLET | ORAL | Status: DC | PRN
  Administered 2016-07-23 – 2016-07-25 (×3): 2 via ORAL
  Filled 2016-07-23 (×4): qty 2

## 2016-07-23 MED ORDER — LACTATED RINGERS IV BOLUS (SEPSIS)
500.0000 mL | Freq: Once | INTRAVENOUS | Status: AC
  Administered 2016-07-23: 500 mL via INTRAVENOUS

## 2016-07-23 MED ORDER — OXYTOCIN 40 UNITS IN LACTATED RINGERS INFUSION - SIMPLE MED: 2.5000 [IU]/h | INTRAVENOUS | Status: AC

## 2016-07-23 MED ORDER — MENTHOL 3 MG MT LOZG: 1.0000 | LOZENGE | OROMUCOSAL | Status: DC | PRN

## 2016-07-23 MED ORDER — FERROUS SULFATE 325 (65 FE) MG PO TABS
325.0000 mg | ORAL_TABLET | Freq: Every day | ORAL | Status: DC
  Administered 2016-07-24 – 2016-07-25 (×2): 325 mg via ORAL
  Filled 2016-07-23: qty 1

## 2016-07-23 MED ORDER — OXYCODONE-ACETAMINOPHEN 5-325 MG PO TABS
1.0000 | ORAL_TABLET | ORAL | Status: DC | PRN
  Administered 2016-07-23 – 2016-07-24 (×5): 1 via ORAL
  Filled 2016-07-23 (×4): qty 1

## 2016-07-23 MED ORDER — ZOLPIDEM TARTRATE 5 MG PO TABS: 5.0000 mg | ORAL_TABLET | Freq: Every evening | ORAL | Status: DC | PRN

## 2016-07-23 MED ORDER — SENNOSIDES-DOCUSATE SODIUM 8.6-50 MG PO TABS
2.0000 | ORAL_TABLET | ORAL | Status: DC
  Administered 2016-07-23 – 2016-07-24 (×2): 2 via ORAL
  Filled 2016-07-23 (×2): qty 2

## 2016-07-23 MED ORDER — COCONUT OIL OIL: 1.0000 "application " | TOPICAL_OIL | Status: DC | PRN

## 2016-07-23 MED ORDER — SIMETHICONE 80 MG PO CHEW
80.0000 mg | CHEWABLE_TABLET | Freq: Three times a day (TID) | ORAL | Status: DC
  Administered 2016-07-23 – 2016-07-25 (×6): 80 mg via ORAL
  Filled 2016-07-23 (×6): qty 1

## 2016-07-23 MED ORDER — FERROUS SULFATE 325 (65 FE) MG PO TABS
325.0000 mg | ORAL_TABLET | Freq: Two times a day (BID) | ORAL | Status: DC
  Administered 2016-07-23 – 2016-07-24 (×3): 325 mg via ORAL
  Filled 2016-07-23 (×5): qty 1

## 2016-07-23 MED ORDER — SIMETHICONE 80 MG PO CHEW
80.0000 mg | CHEWABLE_TABLET | ORAL | Status: DC
  Administered 2016-07-23 – 2016-07-24 (×2): 80 mg via ORAL
  Filled 2016-07-23 (×2): qty 1

## 2016-07-23 MED ORDER — PRENATAL MULTIVITAMIN CH
1.0000 | ORAL_TABLET | Freq: Every day | ORAL | Status: DC
  Administered 2016-07-23 – 2016-07-25 (×3): 1 via ORAL
  Filled 2016-07-23 (×3): qty 1

## 2016-07-23 MED ORDER — DIPHENHYDRAMINE HCL 25 MG PO CAPS: 25.0000 mg | ORAL_CAPSULE | Freq: Four times a day (QID) | ORAL | Status: DC | PRN

## 2016-07-23 MED ORDER — METHYLERGONOVINE MALEATE 0.2 MG/ML IJ SOLN: 0.2000 mg | INTRAMUSCULAR | Status: DC | PRN

## 2016-07-23 MED ORDER — WITCH HAZEL-GLYCERIN EX PADS: 1.0000 "application " | MEDICATED_PAD | CUTANEOUS | Status: DC | PRN

## 2016-07-23 MED ORDER — SIMETHICONE 80 MG PO CHEW: 80.0000 mg | CHEWABLE_TABLET | ORAL | Status: DC | PRN

## 2016-07-23 MED ORDER — METHYLERGONOVINE MALEATE 0.2 MG PO TABS: 0.2000 mg | ORAL_TABLET | ORAL | Status: DC | PRN

## 2016-07-23 MED ORDER — IBUPROFEN 600 MG PO TABS
600.0000 mg | ORAL_TABLET | Freq: Four times a day (QID) | ORAL | Status: DC
  Administered 2016-07-23 – 2016-07-25 (×10): 600 mg via ORAL
  Filled 2016-07-23 (×10): qty 1

## 2016-07-23 MED ORDER — LACTATED RINGERS IV SOLN
INTRAVENOUS | Status: DC
  Administered 2016-07-23: 08:00:00 via INTRAVENOUS
  Administered 2016-07-23: 1 mL via INTRAVENOUS

## 2016-07-23 MED ORDER — DIBUCAINE 1 % RE OINT: 1.0000 "application " | TOPICAL_OINTMENT | RECTAL | Status: DC | PRN

## 2016-07-23 NOTE — Progress Notes (Signed)
Postoperative Note Day # 1  S:  Patient resting comfortable in bed.  Pain controlled.  Tolerating clears. No flatus, no BM.  Lochia moderate.  Ambulating without difficulty.  She denies n/v/f/c, SOB, or CP.  Pt plans on breastfeeding.  O: Temp:  [97.7 F (36.5 C)-98.9 F (37.2 C)] 98.3 F (36.8 C) (12/01 0615) Pulse Rate:  [84-113] 87 (12/01 0615) Resp:  [13-23] 16 (12/01 0615) BP: (109-140)/(65-104) 129/69 (12/01 0615) SpO2:  [97 %-100 %] 98 % (12/01 0615) Weight:  [218 lb (98.9 kg)] 218 lb (98.9 kg) (11/30 1021)   Gen: A&Ox3, NAD CV: RRR, no MRG Resp: CTAB Abdomen: soft, NT, ND +BS Uterus: firm, non-tender, below umbilicus Incision: c/d/i, bandage on  Ext: No edema, no calf tenderness bilaterally, SCDs in place  Labs:  Recent Labs  07/22/16 0900 07/23/16 0547  HGB 12.1 10.7*    A/P: Pt is a 35 y.o. [id number] s/p primary C-section due to placental abruption, POD#1  - Pain well controlled -GU: UOP is adequate, but dark.  Plan for 500cc bolus and will continue to monitor.  If urine improves in color and remains adequate plan for removal later today. -GI: Advance to general diet today -Activity: encouraged sitting up to chair and ambulation as tolerated -Prophylaxis: SCDs in place, early ambulation -Heme: EBL 1200cc due to abruption and atony, s/p Pit and Methergine x 1, CBC stable as above, pt asymptomatic, will continue to closely monitor her symptoms today.  Plan for iron daily -Pt considering inpatient baby boy circ, will perform tomorrow upon approval from PEDS  DISPO: Routine postpartum care as outlined above  Janyth Pupa, DO [phone] (pager) [phone] (office)

## 2016-07-23 NOTE — Anesthesia Postprocedure Evaluation (Signed)
Anesthesia Post Note  Patient: [name]  Procedure(s) Performed: Procedure(s) (LRB): CESAREAN SECTION (N/A)  Patient location during evaluation: Mother Baby Anesthesia Type: Epidural Level of consciousness: awake Pain management: pain level controlled Vital Signs Assessment: post-procedure vital signs reviewed and stable Respiratory status: spontaneous breathing Cardiovascular status: stable Postop Assessment: no headache, no backache, epidural receding and patient able to bend at knees Anesthetic complications: no     Last Vitals:  Vitals:   07/23/16 0215 07/23/16 0615  BP: 126/70 129/69  Pulse: 84 87  Resp: 16 16  Temp: 36.8 C 36.8 C    Last Pain:  Vitals:   07/23/16 0856  TempSrc:   PainSc: 5    Pain Goal: Patients Stated Pain Goal: 5 (07/23/16 0615)               Everette Rank

## 2016-07-23 NOTE — Addendum Note (Signed)
Addendum  created 07/23/16 0902 by Georgeanne Nim, CRNA   Sign clinical note

## 2016-07-23 NOTE — Lactation Note (Signed)
This note was copied from a baby's chart. Lactation Consultation Note:   Infant is 53 hours old and [redacted] weeks gestation. Mother was given Psychologist, educational. And LPI parent teaching sheet.  Mother previously breast fed 2 other children for 6 months.  Assist mother with hand expression, infant was fed 1 ml of ebm by [facility] Mother was given a hand pump at her request and taught how to use.  Reviewed LPI sheet. Informed mother of need to supplement infant even thought the infant is feeding well at the breast.  Mother unsure if she wants to use formula. Mother has a DEBP at the bedside to pump after each feeding. Mother is very tired and unsure what she wants to do . Mother will page for staff nurse or LC to check infants latch. Staff nurse states that she saw the last feeding and that infant was feeding well.   Patient Name: [name] [id number] Date: 07/23/2016     Maternal Data    Feeding Feeding Type: Breast Fed Length of feed: 25 min  LATCH Score/Interventions Latch: Repeated attempts needed to sustain latch, nipple held in mouth throughout feeding, stimulation needed to elicit sucking reflex. Intervention(s): Skin to skin;Teach feeding cues;Waking techniques Intervention(s): Adjust position;Assist with latch;Breast massage  Audible Swallowing: A few with stimulation Intervention(s): Skin to skin  Type of Nipple: Everted at rest and after stimulation  Comfort (Breast/Nipple): Soft / non-tender     Hold (Positioning): Assistance needed to correctly position infant at breast and maintain latch.  LATCH Score: 7  Lactation Tools Discussed/Used Tools: Pump Breast pump type: Double-Electric Breast Pump   Consult Status      Darla Lesches 07/23/2016, 11:58 AM

## 2016-07-24 NOTE — Lactation Note (Signed)
This note was copied from a baby's chart. Lactation Consultation Note  LPI 51 hours old.  Mother states she recently pumped 48 ml and hand expressed an additional 20 ml. Mother will continue bf every 3 hours, pump and giving him back volume pumped. No questions or problems. Praised mother for her efforts.    Patient Name: [name] [id number] Date: 07/24/2016 Reason for consult: Follow-up assessment   Maternal Data    Feeding Feeding Type: Breast Fed Length of feed: 30 min  LATCH Score/Interventions                      Lactation Tools Discussed/Used Pump Review: Setup, frequency, and cleaning;Milk Storage   Consult Status Consult Status: Follow-up Date: 07/25/16 Follow-up type: In-patient    Vivianne Master [facility] 07/24/2016, 3:29 PM

## 2016-07-24 NOTE — Progress Notes (Signed)
Postoperative Note Day # 2  S:  Patient resting comfortable in bed.  Pain controlled.  Tolerating clears. + flatus, no BM.  Lochia moderate.  Ambulating without difficulty.  She denies n/v/f/c, SOB, or CP.  Pt plans on breastfeeding.  O: Temp:  [97.9 F (36.6 C)-98.4 F (36.9 C)] 97.9 F (36.6 C) (12/02 0546) Pulse Rate:  [83-92] 85 (12/02 0546) Resp:  [16-19] 19 (12/02 0546) BP: (107-128)/(58-64) 107/64 (12/02 0546) SpO2:  [100 %] 100 % (12/01 1832)   Gen: A&Ox3, NAD CV: RRR, no MRG Resp: CTAB Abdomen: soft, NT, ND +BS Uterus: firm, non-tender, below umbilicus Incision: Honeycomb dressing with old blood noted, no active bleeding Ext: No edema, no calf tenderness bilaterally, SCDs in place  Labs:   Recent Labs  07/22/16 0900 07/23/16 0547  HGB 12.1 10.7*    A/P: Pt is a 35 y.o. [id number] s/p primary C-section due to placental abruption, POD#2  - Pain well controlled -GU: Voiding freely -GI: Tolerating general diet -Activity: encouraged sitting up to chair and ambulation as tolerated -Prophylaxis: SCDs in place, early ambulation -Heme: EBL 1200cc due to abruption and atony, s/p Pit and Methergine x 1, pt asymptomatic, on iron daily -Baby boy circ completed  DISPO: Continue routine postpartum care  Janyth Pupa, DO [phone] (pager) [phone] (office)

## 2016-07-24 NOTE — Lactation Note (Signed)
This note was copied from a baby's chart. Lactation Consultation Note  Patient Name: [name] [id number] Date: 07/24/2016 Reason for consult: Follow-up assessment   With this mom and sleepy baby. Mom had baby dressed ans wrapped in blanket, and was concerned that he would not wake up.Mom was concerned about his temp, since his temp was on the low side yesterday. I pointed out how warm he felt, from lots of skin to skin, and how flushed he appeared. I advised mom to keep him in a diaper, wrapped in a blanket, with a hat, and that should be enough to keep him warm, and not overheated. Marland Kitchen  He had not fed in 4 1/2 hours, but mom has abundant supply, and baby had breast fed and was supplemented with EBm after last feeding. Mom helped me undress the baby, and place him skin to skin. In football hold, he latched easily, with strong suckles and lots of visible and audible swallows.  Mom's breasts are filling, firm , maybe slightly engorged. I brought her ice packs to apply 20 minutes at a time to her breasts, and advised her to try reverse breast massage, while lying flat in bed, using coconut oil. Mom is pumping every 2 hours, in addition to pumping. I advised her to try and pump every 3, and just to comfort. Mom knows to call for questions/conerns.    Maternal Data    Feeding Feeding Type: Breast Fed Length of feed: 10 min  LATCH Score/Interventions Latch: Grasps breast easily, tongue down, lips flanged, rhythmical sucking. Intervention(s): Skin to skin;Teach feeding cues Intervention(s): Adjust position;Assist with latch (showed mom football hold)  Audible Swallowing: Spontaneous and intermittent (lots of audible swallows)  Type of Nipple: Everted at rest and after stimulation  Comfort (Breast/Nipple): Filling, red/small blisters or bruises, mild/mod discomfort Problem noted: Engorgment (mild engorgement beginning) Intervention(s): Ice  Problem noted: Filling;Mild/Moderate  discomfort Interventions (Filling): Frequent nursing;Massage;Firm support;Double electric pump (reverse massage with cocnut oil while lying flat in bed, for severe engorgement)  Hold (Positioning): Assistance needed to correctly position infant at breast and maintain latch.  LATCH Score: 8  Lactation Tools Discussed/Used Pump Review: Setup, frequency, and cleaning;Milk Storage   Consult Status Consult Status: Follow-up Date: 07/25/16 Follow-up type: In-patient    Tonna Corner 07/24/2016, 4:45 PM

## 2016-07-25 MED ORDER — SENNOSIDES-DOCUSATE SODIUM 8.6-50 MG PO TABS: 1.0000 | ORAL_TABLET | Freq: Two times a day (BID) | ORAL | 0 refills | Status: DC

## 2016-07-25 MED ORDER — SERTRALINE HCL 50 MG PO TABS: 50.0000 mg | ORAL_TABLET | Freq: Every day | ORAL | 6 refills | Status: DC

## 2016-07-25 MED ORDER — FERROUS SULFATE 325 (65 FE) MG PO TABS: 325.0000 mg | ORAL_TABLET | Freq: Every day | ORAL | 3 refills | Status: DC

## 2016-07-25 MED ORDER — IBUPROFEN 600 MG PO TABS: 600.0000 mg | ORAL_TABLET | Freq: Four times a day (QID) | ORAL | 0 refills | Status: DC

## 2016-07-25 MED ORDER — OXYCODONE-ACETAMINOPHEN 5-325 MG PO TABS: 1.0000 | ORAL_TABLET | Freq: Four times a day (QID) | ORAL | 0 refills | Status: DC | PRN

## 2016-07-25 NOTE — Progress Notes (Addendum)
Postoperative Note Day # 3  S:  Patient resting comfortable in bed.  Pain controlled.  Tolerating clears. + flatus, no BM.  Lochia moderate.  Ambulating without difficulty.  She denies n/v/f/c, SOB, or CP.  Pt plans on breastfeeding.  O: Temp:  [98 F (36.7 C)-98.8 F (37.1 C)] 98 F (36.7 C) (12/03 0523) Pulse Rate:  [91-98] 91 (12/03 0523) Resp:  [16] 16 (12/03 0523) BP: (122-123)/(62-68) 123/62 (12/03 0523) SpO2:  [100 %] 100 % (12/02 1807)   Gen: A&Ox3, NAD CV: RRR, no MRG Resp: CTAB Abdomen: soft, NT, ND +BS Uterus: firm, non-tender, below umbilicus Incision: Honeycomb removed, steris in placed/saturated with old blood- no active bleeding Ext: No edema, no calf tenderness bilaterally, SCDs in place  Labs:   Recent Labs  07/22/16 0900 07/23/16 0547  HGB 12.1 10.7*    A/P: Pt is a 35 y.o. [id number] s/p primary C-section due to placental abruption, POD#3  - Pain well controlled -GU: Voiding freely -GI: Tolerating general diet -Activity: encouraged sitting up to chair and ambulation as tolerated -Prophylaxis: SCDs in place, early ambulation -Heme: EBL 1200cc due to abruption and atony, s/p Pit and Methergine x 1, pt asymptomatic, on iron daily -Baby boy circ completed -h/o Postpartum depression- plan to restart zoloft 50mg  daily  DISPO: Meeting postoperative milestones appropriately, plan for discharge home today  Janyth Pupa, DO [phone] (pager) [phone] (office)

## 2016-07-25 NOTE — Discharge Instructions (Signed)
Cesarean Delivery, Care After Refer to this sheet in the next few weeks. These instructions provide you with information about caring for yourself after your procedure. Your health care provider may also give you more specific instructions. Your treatment has been planned according to current medical practices, but problems sometimes occur. Call your health care provider if you have any problems or questions after your procedure. What can I expect after the procedure? After the procedure, it is common to have:  A small amount of blood or clear fluid coming from the incision.  Some redness, swelling, and pain in your incision area.  Some abdominal pain and soreness.  Vaginal bleeding (lochia).  Pelvic cramps.  Fatigue. Follow these instructions at home: Incision care  Follow instructions from your health care provider about how to take care of your incision. Make sure you:  Wash your hands with soap and water before you change your bandage (dressing). If soap and water are not available, use hand sanitizer.   These skin closures may need to stay in place for 2 weeks or longer. If adhesive strip edges start to loosen and curl up, you may remove them. Check your incision area every day for signs of infection. Check for:  More redness, swelling, or pain.  More fluid or blood.  Warmth.  Pus or a bad smell.  When you cough or sneeze, hug a pillow. This helps with pain and decreases the chance of your incision opening up (dehiscing). Do this until your incision heals.   Medicines  Take over-the-counter and prescription medicines only as told by your health care provider. For pain management please alternate between the Ibuprofen and Percocet.  Do not drive while taking Percocet.  Percocet may cause constipation, please continue the stool softener twice daily while on this medication.  Additionally, the iron may cause constipation.  It is ok to use over the counter medication to help  you have a bowel movement.  If you were prescribed an antibiotic medicine, take it as told by your health care provider. Do not stop taking the antibiotic until it is finished. Driving  Do not drive or operate heavy machinery while taking prescription pain medicine.  Do not drive for 24 hours if you received a sedative. Lifestyle  Do not drink alcohol. This is especially important if you are breastfeeding or taking pain medicine.  Do not use tobacco products, including cigarettes, chewing tobacco, or e-cigarettes. If you need help quitting, ask your health care provider. Tobacco can delay wound healing. Eating and drinking  Drink at least 8 eight-ounce glasses of water every day unless told not to by your health care provider. If you breastfeed, you may need to drink more water than this.  Eat high-fiber foods every day. These foods may help prevent or relieve constipation. High-fiber foods include:  Whole grain cereals and breads.  Brown rice.  Beans.  Fresh fruits and vegetables. Activity  Return to your normal activities as told by your health care provider. Ask your health care provider what activities are safe for you.  Rest as much as possible. Try to rest or take a nap while your baby is sleeping.  Do not lift anything that is heavier than your baby or 10 lb (4.5 kg) as told by your health care provider.  Ask your health care provider when you can engage in sexual activity. This may depend on your:  Risk of infection.  Healing rate.  Comfort and desire to engage in sexual activity.  Bathing  Do not take baths, swim, or use a hot tub until your health care provider approves. Ask your health care provider if you can take showers. You may only be allowed to take sponge baths until your incision heals.  Keep your dressing dry as told by your health care provider. General instructions  Do not use tampons or douches until your health care provider  approves.  Wear:  Loose, comfortable clothing.  A supportive and well-fitting bra.  Watch for any blood clots that may pass from your vagina. These may look like clumps of dark red, brown, or black discharge.  Keep your perineum clean and dry as told by your health care provider.  Wipe from front to back when you use the toilet.  If possible, have someone help you care for your baby and help with household activities for a few days after you leave the hospital.  Keep all follow-up visits for you and your baby as told by your health care provider. This is important. Contact a health care provider if:  You have:  Bad-smelling vaginal discharge.  Difficulty urinating.  Pain when urinating.  A sudden increase or decrease in the frequency of your bowel movements.  More redness, swelling, or pain around your incision.  More fluid or blood coming from your incision.  Pus or a bad smell coming from your incision.  A fever.  A rash.  Little or no interest in activities you used to enjoy.  Questions about caring for yourself or your baby.  Nausea.  Your incision feels warm to the touch.  Your breasts turn red or become painful or hard.  You feel unusually sad or worried.  You vomit.  You pass large blood clots from your vagina. If you pass a blood clot, save it to show to your health care provider. Do not flush blood clots down the toilet without showing your health care provider.  You urinate more than usual.  You are dizzy or light-headed.  You have not breastfed and have not had a menstrual period for 12 weeks after delivery.  You stopped breastfeeding and have not had a menstrual period for 12 weeks after stopping breastfeeding. Get help right away if:  You have:  Pain that does not go away or get better with medicine.  Chest pain.  Difficulty breathing.  Blurred vision or spots in your vision.  Thoughts about hurting yourself or your baby.  New  pain in your abdomen or in one of your legs.  A severe headache.  You faint.  You bleed from your vagina so much that you fill two sanitary pads in one hour. This information is not intended to replace advice given to you by your health care provider. Make sure you discuss any questions you have with your health care provider. Document Released: 05/01/2002 Document Revised: 12/18/2015 Document Reviewed: 07/14/2015 Elsevier Interactive Patient Education  2017 [employer].

## 2016-07-25 NOTE — Progress Notes (Signed)
Dr Nelda Marseille notified of pt's complaint of intermittent numbness to left lower leg. No new orders given.  Pt advised of signs and symptoms to notify to physician. Pt verbalized understanding. Will continue to monitor pt.

## 2016-07-25 NOTE — Progress Notes (Signed)
Pt discharged per order. Pt refused wheelchair to car. Pt ambulated to car. Pt discharged stable.

## 2016-07-25 NOTE — Discharge Summary (Signed)
OB Discharge Summary     Patient Name: [name] DOB: 03-10-1981 MRN: [id number]  Date of admission: 07/22/2016 Delivering MD: Christophe Louis   Date of discharge: 07/25/2016  Admitting diagnosis: WATER BROKE Intrauterine pregnancy: [redacted]w[redacted]d     Secondary diagnosis:  Active Problems:   Normal labor  Additional problems: Suspected placental abruption     Discharge diagnosis: Preterm Pregnancy Delivered                                                                                                Post partum procedures:none  Augmentation: Pitocin  Complications: Placental Abruption and Hemorrhage>1026mL  Hospital course:  Onset of Labor With Unplanned C/S  35 y.o. yo [id number] at [redacted]w[redacted]d was admitted in Latent Labor on 07/22/2016. Patient had a labor course significant for vaginal bleeding that was concerning for placental abruption. Membrane Rupture Time/Date: 6:00 AM ,07/22/2016   The patient went for cesarean section due to vaginal bleeding suspected abruption, and delivered a Viable infant,07/22/2016  Details of operation can be found in separate operative note. Patient had an uncomplicated postpartum course.  She is ambulating,tolerating a regular diet, passing flatus, and urinating well.  Patient is discharged home in stable condition 07/25/16.   Physical exam Vitals:   07/23/16 1832 07/24/16 0546 07/24/16 1807 07/25/16 0523  BP: 128/60 107/64 122/68 123/62  Pulse: 92 85 98 91  Resp: 16 19 16 16   Temp: 98.4 F (36.9 C) 97.9 F (36.6 C) 98.8 F (37.1 C) 98 F (36.7 C)  TempSrc: Oral Oral Oral Oral  SpO2: 100%  100%   Weight:      Height:       General: alert, cooperative and no distress Lochia: appropriate Uterine Fundus: firm Incision: Healing well with no significant drainage DVT Evaluation: No evidence of DVT seen on physical exam. Labs: Lab Results  Component Value Date   WBC 17.3 (H) 07/23/2016   HGB 10.7 (L) 07/23/2016   HCT 31.5 (L) 07/23/2016   MCV 81.8  07/23/2016   PLT 162 07/23/2016   CMP Latest Ref Rng & Units 05/21/2015  Glucose 65 - 99 mg/dL 88  BUN 6 - 20 mg/dL 10  Creatinine 0.57 - 1.00 mg/dL 0.85  Sodium 134 - 144 mmol/L 140  Potassium 3.5 - 5.2 mmol/L 4.7  Chloride 97 - 108 mmol/L 100  CO2 18 - 29 mmol/L 22  Calcium 8.7 - 10.2 mg/dL 9.7  Total Protein 6.0 - 8.5 g/dL 7.1  Total Bilirubin 0.0 - 1.2 mg/dL 0.3  Alkaline Phos 39 - 117 IU/L 65  AST 0 - 40 IU/L 15  ALT 0 - 32 IU/L 18    Discharge instruction: per After Visit Summary and "Baby and Me Booklet".  After visit meds:    Medication List    TAKE these medications   ferrous sulfate 325 (65 FE) MG tablet Take 1 tablet (325 mg total) by mouth daily with breakfast.   ibuprofen 600 MG tablet Commonly known as:  ADVIL,MOTRIN Take 1 tablet (600 mg total) by mouth every 6 (six) hours.   oxyCODONE-acetaminophen 5-325 MG tablet Commonly  known as:  PERCOCET/ROXICET Take 1-2 tablets by mouth every 6 (six) hours as needed (pain scale 4-7).   prenatal multivitamin Tabs tablet Take 1 tablet by mouth daily at 12 noon.   senna-docusate 8.6-50 MG tablet Commonly known as:  Senokot-S Take 1 tablet by mouth 2 (two) times daily.   sertraline 50 MG tablet Commonly known as:  ZOLOFT Take 1 tablet (50 mg total) by mouth daily.   valACYclovir 1000 MG tablet Commonly known as:  VALTREX Take 1,000 mg by mouth 2 (two) times daily.       Diet: routine diet  Activity: Advance as tolerated. Pelvic rest for 6 weeks.   Outpatient follow up:2 weeks Follow up Appt:No future appointments. Follow up Visit:No Follow-up on file.  Postpartum contraception: Not Discussed  Newborn Data: Live born female  Birth Weight: 6 lb 14.6 oz (3135 g) APGAR: 7, 9  Baby Feeding: Breast Disposition:home with mother   07/25/2016 Janyth Pupa, M, DO

## 2016-07-25 NOTE — Lactation Note (Signed)
This note was copied from a baby's chart. Lactation Consultation Note  36 week, LPI, 4.7% weight loss during the night 8.3% total. Family going more than 3 hours between feedings.  Recommend family feed baby on schedule q3hr, waking to feed if needed. Mother has lots of pumped breastmilk stored in refrig.  Suggest giving it to baby after feedings. Reviewed volume guidelines and suggest 20-30 ml based on hours of life and transition to slow flow nipple bottle from syringe. Baby latched on R breast for 10 min and became sleepy.  Helped mother w/ positioning and placing pillow under baby. Baby latched briefly on L slightly larger nipple and again became sleepy.  Discussed this is WNL for LPI infant.  Discussed LPI feeding behavior. Had mother burp and then she gave baby pumped breastmilk approx 25 ml with bottle using paced feeding. Suggest she post pump 4-6 times a day for 10-20 min.  Give supplemental breastmilk after every feeding if baby is willing. Reviewed engorgement care and monitoring voids/stools. Mother has personal DEBP at home. Suggest she call if she has further questions or concerns.   Patient Name: [name] [id number] Date: 07/25/2016 Reason for consult: Late preterm infant;Infant weight loss   Maternal Data    Feeding Feeding Type: Breast Fed Length of feed: 10 min  LATCH Score/Interventions Latch: Repeated attempts needed to sustain latch, nipple held in mouth throughout feeding, stimulation needed to elicit sucking reflex.  Audible Swallowing: Spontaneous and intermittent  Type of Nipple: Everted at rest and after stimulation  Comfort (Breast/Nipple): Filling, red/small blisters or bruises, mild/mod discomfort Problem noted:  (massage)  Problem noted: Filling Interventions (Filling): Frequent nursing;Massage;Double electric pump  Hold (Positioning): No assistance needed to correctly position infant at breast.  LATCH Score: 8  Lactation Tools  Discussed/Used     Consult Status Consult Status: Complete    Carlye Grippe 07/25/2016, 9:22 AM

## 2016-07-26 LAB — TYPE AND SCREEN
Blood Product Expiration Date: 201712182359
Blood Product Expiration Date: 201712192359
UNIT TYPE AND RH: 5100
Unit Type and Rh: 5100

## 2016-12-14 ENCOUNTER — Ambulatory Visit (INDEPENDENT_AMBULATORY_CARE_PROVIDER_SITE_OTHER): Payer: BLUE CROSS/BLUE SHIELD | Admitting: Family

## 2016-12-14 ENCOUNTER — Ambulatory Visit: Payer: BLUE CROSS/BLUE SHIELD | Admitting: Family Medicine

## 2016-12-14 ENCOUNTER — Encounter: Payer: Self-pay | Admitting: Family

## 2016-12-14 VITALS — BP 122/86 | HR 88 | Temp 98.4°F | Ht 66.0 in | Wt 182.4 lb

## 2016-12-14 DIAGNOSIS — Z0000 Encounter for general adult medical examination without abnormal findings: Secondary | ICD-10-CM

## 2016-12-14 LAB — COMPREHENSIVE METABOLIC PANEL
ALBUMIN: 4.4 g/dL (ref 3.5–5.2)
ALK PHOS: 70 U/L (ref 39–117)
ALT: 21 U/L (ref 0–35)
AST: 16 U/L (ref 0–37)
BILIRUBIN TOTAL: 0.7 mg/dL (ref 0.2–1.2)
BUN: 8 mg/dL (ref 6–23)
CO2: 29 mEq/L (ref 19–32)
CREATININE: 0.69 mg/dL (ref 0.40–1.20)
Calcium: 9.7 mg/dL (ref 8.4–10.5)
Chloride: 103 mEq/L (ref 96–112)
GFR: 125.16 mL/min (ref >60.00)
GLUCOSE: 93 mg/dL (ref 70–99)
Potassium: 4 mEq/L (ref 3.5–5.1)
SODIUM: 139 meq/L (ref 135–145)
TOTAL PROTEIN: 6.8 g/dL (ref 6.0–8.3)

## 2016-12-14 LAB — CBC WITH DIFFERENTIAL/PLATELET
Basophils Absolute: 0 10*3/uL (ref 0.0–0.1)
Basophils Relative: 0.5 % (ref 0.0–3.0)
EOS PCT: 1.5 % (ref 0.0–5.0)
Eosinophils Absolute: 0.1 10*3/uL (ref 0.0–0.7)
HCT: 38.8 % (ref 36.0–46.0)
HEMOGLOBIN: 12.8 g/dL (ref 12.0–15.0)
Lymphocytes Relative: 32.8 % (ref 12.0–46.0)
Lymphs Abs: 2.1 10*3/uL (ref 0.7–4.0)
MCHC: 32.9 g/dL (ref 30.0–36.0)
MCV: 79.1 fl (ref 78.0–100.0)
MONOS PCT: 7.2 % (ref 3.0–12.0)
Monocytes Absolute: 0.5 10*3/uL (ref 0.1–1.0)
Neutro Abs: 3.7 10*3/uL (ref 1.4–7.7)
Neutrophils Relative %: 58 % (ref 43.0–77.0)
Platelets: 252 10*3/uL (ref 150.0–400.0)
RBC: 4.9 Mil/uL (ref 3.87–5.11)
RDW: 14.6 % (ref 11.5–15.5)
WBC: 6.4 10*3/uL (ref 4.0–10.5)

## 2016-12-14 LAB — LIPID PANEL
CHOL/HDL RATIO: 3
Cholesterol: 199 mg/dL (ref 0–200)
HDL: 59.7 mg/dL (ref >39.00)
LDL Cholesterol: 117 mg/dL — ABNORMAL HIGH (ref 0–99)
NONHDL: 138.84
Triglycerides: 110 mg/dL (ref 0.0–149.0)
VLDL: 22 mg/dL (ref 0.0–40.0)

## 2016-12-14 LAB — URINALYSIS, ROUTINE W REFLEX MICROSCOPIC
Bilirubin Urine: NEGATIVE
HGB URINE DIPSTICK: NEGATIVE
Ketones, ur: NEGATIVE
Leukocytes, UA: NEGATIVE
Nitrite: NEGATIVE
PH: 6.5 (ref 5.0–8.0)
RBC / HPF: NONE SEEN (ref 0–?)
Specific Gravity, Urine: 1.025 (ref 1.000–1.030)
Urine Glucose: NEGATIVE
Urobilinogen, UA: 1 (ref 0.0–1.0)
WBC, UA: NONE SEEN (ref 0–?)

## 2016-12-14 LAB — VITAMIN D 25 HYDROXY (VIT D DEFICIENCY, FRACTURES): VITD: 17.76 ng/mL — ABNORMAL LOW (ref 30.00–100.00)

## 2016-12-14 LAB — HEMOGLOBIN A1C: Hgb A1c MFr Bld: 5.8 % (ref 4.6–6.5)

## 2016-12-14 LAB — TSH: TSH: 0.63 u[IU]/mL (ref 0.35–4.50)

## 2016-12-14 NOTE — Progress Notes (Signed)
Subjective:    Patient ID: [name], female    DOB: 1981/02/22, 36 y.o.   MRN: [id number]  CC: [name] is a 36 y.o. female who presents today for physical exam.    HPI: Feeling well. Only on PNV.   No complaints.       Colorectal Cancer Screening: No early family history.  Breast Cancer Screening: No early family history.  Cervical Cancer Screening: UTD per patient; follows with OB.  Bone Health screening/DEXA for 65+: No increased fracture risk. Defer screening at this time. Lung Cancer Screening: Doesn't have 30 year pack year history and age > 57 years.       Tetanus - unknown          Labs: Screening labs today. Exercise: No regular exercise at this time. Alcohol use: none Smoking/tobacco use: Nonsmoker.  Regular dental exams: UTD Wears seat belt: Yes. Skin: no concerning lesions or h/o skin cancer  HISTORY:  Past Medical History:  Diagnosis Date  . Pregnant     Past Surgical History:  Procedure Laterality Date  . CESAREAN SECTION N/A 07/22/2016   Procedure: CESAREAN SECTION;  Surgeon: Christophe Louis, MD;  Location: [facility];  Service: Obstetrics;  Laterality: N/A;   Family History  Problem Relation Age of Onset  . Colon cancer Neg Hx   . Breast cancer Neg Hx   . Ovarian cancer Neg Hx       ALLERGIES: Patient has no known allergies.  Current Outpatient Prescriptions on File Prior to Visit  Medication Sig Dispense Refill  . ferrous sulfate 325 (65 FE) MG tablet Take 1 tablet (325 mg total) by mouth daily with breakfast. 30 tablet 3  . ibuprofen (ADVIL,MOTRIN) 600 MG tablet Take 1 tablet (600 mg total) by mouth every 6 (six) hours. 30 tablet 0  . oxyCODONE-acetaminophen (PERCOCET/ROXICET) 5-325 MG tablet Take 1-2 tablets by mouth every 6 (six) hours as needed (pain scale 4-7). 30 tablet 0  . Prenatal Vit-Fe Fumarate-FA (PRENATAL MULTIVITAMIN) TABS tablet Take 1 tablet by mouth daily at 12 noon.    . senna-docusate (SENOKOT-S) 8.6-50 MG  tablet Take 1 tablet by mouth 2 (two) times daily. 60 tablet 0  . sertraline (ZOLOFT) 50 MG tablet Take 1 tablet (50 mg total) by mouth daily. 30 tablet 6  . valACYclovir (VALTREX) 1000 MG tablet Take 1,000 mg by mouth 2 (two) times daily.     No current facility-administered medications on file prior to visit.     Social History  Substance Use Topics  . Smoking status: Former Research scientist (life sciences)  . Smokeless tobacco: Never Used  . Alcohol use No    Review of Systems  Constitutional: Negative for chills, fever and unexpected weight change.  HENT: Negative for congestion.   Respiratory: Negative for cough.   Cardiovascular: Negative for chest pain, palpitations and leg swelling.  Gastrointestinal: Negative for abdominal distention, nausea and vomiting.  Genitourinary: Negative for dyspareunia.  Musculoskeletal: Negative for arthralgias and myalgias.  Skin: Negative for rash.  Neurological: Negative for headaches.  Hematological: Negative for adenopathy.  Psychiatric/Behavioral: Negative for confusion.      Objective:    BP 122/86   Pulse 88   Temp 98.4 F (36.9 C) (Oral)   Ht 5\' 6"  (1.676 m)   Wt 182 lb 6.4 oz (82.7 kg)   SpO2 98%   BMI 29.44 kg/m   BP Readings from Last 3 Encounters:  12/14/16 122/86  07/25/16 123/62  05/30/16 127/64   Wt Readings  from Last 3 Encounters:  12/14/16 182 lb 6.4 oz (82.7 kg)  07/22/16 218 lb (98.9 kg)  05/30/16 209 lb 9.6 oz (95.1 kg)    Physical Exam  Constitutional: She appears well-developed and well-nourished.  HENT:  Head: Normocephalic and atraumatic.  Right Ear: Hearing, tympanic membrane, external ear and ear canal normal. No drainage, swelling or tenderness. No foreign bodies. Tympanic membrane is not erythematous and not bulging. No middle ear effusion. No decreased hearing is noted.  Left Ear: Hearing, tympanic membrane, external ear and ear canal normal. No drainage, swelling or tenderness. No foreign bodies. Tympanic membrane is  not erythematous and not bulging.  No middle ear effusion. No decreased hearing is noted.  Nose: Nose normal. No rhinorrhea. Right sinus exhibits no maxillary sinus tenderness and no frontal sinus tenderness. Left sinus exhibits no maxillary sinus tenderness and no frontal sinus tenderness.  Mouth/Throat: Uvula is midline, oropharynx is clear and moist and mucous membranes are normal. No oropharyngeal exudate, posterior oropharyngeal edema, posterior oropharyngeal erythema or tonsillar abscesses.  Able to hear whispers and rubbing of fingers  Eyes: Conjunctivae are normal.  Neck: No thyroid mass and no thyromegaly present.  Cardiovascular: Normal rate, regular rhythm, normal heart sounds and normal pulses.   Pulmonary/Chest: Effort normal and breath sounds normal. She has no wheezes. She has no rhonchi. She has no rales. Right breast exhibits no inverted nipple, no mass, no nipple discharge, no skin change and no tenderness. Left breast exhibits no inverted nipple, no mass, no nipple discharge, no skin change and no tenderness. Breasts are symmetrical.  CBE performed.   Lymphadenopathy:       Head (right side): No submental, no submandibular, no tonsillar, no preauricular, no posterior auricular and no occipital adenopathy present.       Head (left side): No submental, no submandibular, no tonsillar, no preauricular, no posterior auricular and no occipital adenopathy present.    She has no cervical adenopathy.       Right cervical: No superficial cervical, no deep cervical and no posterior cervical adenopathy present.      Left cervical: No superficial cervical, no deep cervical and no posterior cervical adenopathy present.    She has no axillary adenopathy.  Neurological: She is alert.  Skin: Skin is warm and dry.  Psychiatric: She has a normal mood and affect. Her speech is normal and behavior is normal. Thought content normal.  Vitals reviewed.      Assessment & Plan:   Problem List Items  Addressed This Visit      Other   Routine physical examination - Primary    Will bring back titers and immunization records so I can complete paperwork for school. UTD pap per patient and follows with OB. CBE performed today. Screening labs. Encouraged exercise.       Relevant Orders   CBC with Differential/Platelet   Comprehensive metabolic panel   Hemoglobin A1c   Lipid panel   VITAMIN D 25 Hydroxy (Vit-D Deficiency, Fractures)   TSH   Urinalysis, Routine w reflex microscopic   PPD       I am having Ms. Abreu maintain her valACYclovir, prenatal multivitamin, ferrous sulfate, ibuprofen, oxyCODONE-acetaminophen, senna-docusate, and sertraline.   No orders of the defined types were placed in this encounter.   Return precautions given.   Risks, benefits, and alternatives of the medications and treatment plan prescribed today were discussed, and patient expressed understanding.   Education regarding symptom management and diagnosis given to patient on  AVS.   Continue to follow with Coral Spikes, DO for routine health maintenance.   [name] and I agreed with plan.   Mable Paris, FNP

## 2016-12-14 NOTE — Progress Notes (Signed)
Pre visit review using our clinic review tool, if applicable. No additional management support is needed unless otherwise documented below in the visit note. 

## 2016-12-14 NOTE — Patient Instructions (Signed)
Pleasure meeting you and best of luck this last leg of school  Please bring vaccine records and fill out paper work; happy to sign and get back to you.    Health Maintenance, Female Adopting a healthy lifestyle and getting preventive care can go a long way to promote health and wellness. Talk with your health care provider about what schedule of regular examinations is right for you. This is a good chance for you to check in with your provider about disease prevention and staying healthy. In between checkups, there are plenty of things you can do on your own. Experts have done a lot of research about which lifestyle changes and preventive measures are most likely to keep you healthy. Ask your health care provider for more information. Weight and diet Eat a healthy diet  Be sure to include plenty of vegetables, fruits, low-fat dairy products, and lean protein.  Do not eat a lot of foods high in solid fats, added sugars, or salt.  Get regular exercise. This is one of the most important things you can do for your health.  Most adults should exercise for at least 150 minutes each week. The exercise should increase your heart rate and make you sweat (moderate-intensity exercise).  Most adults should also do strengthening exercises at least twice a week. This is in addition to the moderate-intensity exercise. Maintain a healthy weight  Body mass index (BMI) is a measurement that can be used to identify possible weight problems. It estimates body fat based on height and weight. Your health care provider can help determine your BMI and help you achieve or maintain a healthy weight.  For females 20 years of age and older:  A BMI below 18.5 is considered underweight.  A BMI of 18.5 to 24.9 is normal.  A BMI of 25 to 29.9 is considered overweight.  A BMI of 30 and above is considered obese. Watch levels of cholesterol and blood lipids  You should start having your blood tested for lipids and  cholesterol at 34 years of age, then have this test every 5 years.  You may need to have your cholesterol levels checked more often if:  Your lipid or cholesterol levels are high.  You are older than 34 years of age.  You are at high risk for heart disease. Cancer screening Lung Cancer  Lung cancer screening is recommended for adults 55-80 years old who are at high risk for lung cancer because of a history of smoking.  A yearly low-dose CT scan of the lungs is recommended for people who:  Currently smoke.  Have quit within the past 15 years.  Have at least a 30-pack-year history of smoking. A pack year is smoking an average of one pack of cigarettes a day for 1 year.  Yearly screening should continue until it has been 15 years since you quit.  Yearly screening should stop if you develop a health problem that would prevent you from having lung cancer treatment. Breast Cancer  Practice breast self-awareness. This means understanding how your breasts normally appear and feel.  It also means doing regular breast self-exams. Let your health care provider know about any changes, no matter how small.  If you are in your 20s or 30s, you should have a clinical breast exam (CBE) by a health care provider every 1-3 years as part of a regular health exam.  If you are 40 or older, have a CBE every year. Also consider having a breast X-ray (  mammogram) every year.  If you have a family history of breast cancer, talk to your health care provider about genetic screening.  If you are at high risk for breast cancer, talk to your health care provider about having an MRI and a mammogram every year.  Breast cancer gene (BRCA) assessment is recommended for women who have family members with BRCA-related cancers. BRCA-related cancers include:  Breast.  Ovarian.  Tubal.  Peritoneal cancers.  Results of the assessment will determine the need for genetic counseling and BRCA1 and BRCA2  testing. Cervical Cancer  Your health care provider may recommend that you be screened regularly for cancer of the pelvic organs (ovaries, uterus, and vagina). This screening involves a pelvic examination, including checking for microscopic changes to the surface of your cervix (Pap test). You may be encouraged to have this screening done every 3 years, beginning at age 21.  For women ages 30-65, health care providers may recommend pelvic exams and Pap testing every 3 years, or they may recommend the Pap and pelvic exam, combined with testing for human papilloma virus (HPV), every 5 years. Some types of HPV increase your risk of cervical cancer. Testing for HPV may also be done on women of any age with unclear Pap test results.  Other health care providers may not recommend any screening for nonpregnant women who are considered low risk for pelvic cancer and who do not have symptoms. Ask your health care provider if a screening pelvic exam is right for you.  If you have had past treatment for cervical cancer or a condition that could lead to cancer, you need Pap tests and screening for cancer for at least 20 years after your treatment. If Pap tests have been discontinued, your risk factors (such as having a new sexual partner) need to be reassessed to determine if screening should resume. Some women have medical problems that increase the chance of getting cervical cancer. In these cases, your health care provider may recommend more frequent screening and Pap tests. Colorectal Cancer  This type of cancer can be detected and often prevented.  Routine colorectal cancer screening usually begins at 34 years of age and continues through 34 years of age.  Your health care provider may recommend screening at an earlier age if you have risk factors for colon cancer.  Your health care provider may also recommend using home test kits to check for hidden blood in the stool.  A small camera at the end of a  tube can be used to examine your colon directly (sigmoidoscopy or colonoscopy). This is done to check for the earliest forms of colorectal cancer.  Routine screening usually begins at age 50.  Direct examination of the colon should be repeated every 5-10 years through 34 years of age. However, you may need to be screened more often if early forms of precancerous polyps or small growths are found. Skin Cancer  Check your skin from head to toe regularly.  Tell your health care provider about any new moles or changes in moles, especially if there is a change in a mole's shape or color.  Also tell your health care provider if you have a mole that is larger than the size of a pencil eraser.  Always use sunscreen. Apply sunscreen liberally and repeatedly throughout the day.  Protect yourself by wearing long sleeves, pants, a wide-brimmed hat, and sunglasses whenever you are outside. Heart disease, diabetes, and high blood pressure  High blood pressure causes heart disease   and increases the risk of stroke. High blood pressure is more likely to develop in:  People who have blood pressure in the high end of the normal range (130-139/85-89 mm Hg).  People who are overweight or obese.  People who are African American.  If you are 38-91 years of age, have your blood pressure checked every 3-5 years. If you are 4 years of age or older, have your blood pressure checked every year. You should have your blood pressure measured twice-once when you are at a hospital or clinic, and once when you are not at a hospital or clinic. Record the average of the two measurements. To check your blood pressure when you are not at a hospital or clinic, you can use:  An automated blood pressure machine at a pharmacy.  A home blood pressure monitor.  If you are between 58 years and 52 years old, ask your health care provider if you should take aspirin to prevent strokes.  Have regular diabetes screenings. This  involves taking a blood sample to check your fasting blood sugar level.  If you are at a normal weight and have a low risk for diabetes, have this test once every three years after 34 years of age.  If you are overweight and have a high risk for diabetes, consider being tested at a younger age or more often. Preventing infection Hepatitis B  If you have a higher risk for hepatitis B, you should be screened for this virus. You are considered at high risk for hepatitis B if:  You were born in a country where hepatitis B is common. Ask your health care provider which countries are considered high risk.  Your parents were born in a high-risk country, and you have not been immunized against hepatitis B (hepatitis B vaccine).  You have HIV or AIDS.  You use needles to inject street drugs.  You live with someone who has hepatitis B.  You have had sex with someone who has hepatitis B.  You get hemodialysis treatment.  You take certain medicines for conditions, including cancer, organ transplantation, and autoimmune conditions. Hepatitis C  Blood testing is recommended for:  Everyone born from [birth year] through 1963.  Anyone with known risk factors for hepatitis C. Sexually transmitted infections (STIs)  You should be screened for sexually transmitted infections (STIs) including gonorrhea and chlamydia if:  You are sexually active and are younger than 34 years of age.  You are older than 34 years of age and your health care provider tells you that you are at risk for this type of infection.  Your sexual activity has changed since you were last screened and you are at an increased risk for chlamydia or gonorrhea. Ask your health care provider if you are at risk.  If you do not have HIV, but are at risk, it may be recommended that you take a prescription medicine daily to prevent HIV infection. This is called pre-exposure prophylaxis (PrEP). You are considered at risk if:  You are  sexually active and do not regularly use condoms or know the HIV status of your partner(s).  You take drugs by injection.  You are sexually active with a partner who has HIV. Talk with your health care provider about whether you are at high risk of being infected with HIV. If you choose to begin PrEP, you should first be tested for HIV. You should then be tested every 3 months for as long as you are taking PrEP. Pregnancy  If you are premenopausal and you may become pregnant, ask your health care provider about preconception counseling.  If you may become pregnant, take 400 to 800 micrograms (mcg) of folic acid every day.  If you want to prevent pregnancy, talk to your health care provider about birth control (contraception). Osteoporosis and menopause  Osteoporosis is a disease in which the bones lose minerals and strength with aging. This can result in serious bone fractures. Your risk for osteoporosis can be identified using a bone density scan.  If you are 65 years of age or older, or if you are at risk for osteoporosis and fractures, ask your health care provider if you should be screened.  Ask your health care provider whether you should take a calcium or vitamin D supplement to lower your risk for osteoporosis.  Menopause may have certain physical symptoms and risks.  Hormone replacement therapy may reduce some of these symptoms and risks. Talk to your health care provider about whether hormone replacement therapy is right for you. Follow these instructions at home:  Schedule regular health, dental, and eye exams.  Stay current with your immunizations.  Do not use any tobacco products including cigarettes, chewing tobacco, or electronic cigarettes.  If you are pregnant, do not drink alcohol.  If you are breastfeeding, limit how much and how often you drink alcohol.  Limit alcohol intake to no more than 1 drink per day for nonpregnant women. One drink equals 12 ounces of  beer, 5 ounces of wine, or 1 ounces of hard liquor.  Do not use street drugs.  Do not share needles.  Ask your health care provider for help if you need support or information about quitting drugs.  Tell your health care provider if you often feel depressed.  Tell your health care provider if you have ever been abused or do not feel safe at home. This information is not intended to replace advice given to you by your health care provider. Make sure you discuss any questions you have with your health care provider. Document Released: 02/22/2011 Document Revised: 01/15/2016 Document Reviewed: 05/13/2015 Elsevier Interactive Patient Education  2017 Elsevier Inc.  

## 2016-12-14 NOTE — Assessment & Plan Note (Signed)
Will bring back titers and immunization records so I can complete paperwork for school. UTD pap per patient and follows with OB. CBE performed today. Screening labs. Encouraged exercise.

## 2016-12-15 ENCOUNTER — Other Ambulatory Visit: Payer: Self-pay | Admitting: Family

## 2016-12-15 DIAGNOSIS — D649 Anemia, unspecified: Secondary | ICD-10-CM

## 2016-12-15 NOTE — Progress Notes (Signed)
Patient has seen mychart and is doing labs tomorrow after PPD read

## 2016-12-16 ENCOUNTER — Encounter: Payer: Self-pay | Admitting: Family

## 2016-12-16 ENCOUNTER — Ambulatory Visit: Payer: BLUE CROSS/BLUE SHIELD

## 2016-12-16 ENCOUNTER — Other Ambulatory Visit (INDEPENDENT_AMBULATORY_CARE_PROVIDER_SITE_OTHER): Payer: BLUE CROSS/BLUE SHIELD

## 2016-12-16 DIAGNOSIS — D649 Anemia, unspecified: Secondary | ICD-10-CM | POA: Diagnosis not present

## 2016-12-16 DIAGNOSIS — Z111 Encounter for screening for respiratory tuberculosis: Secondary | ICD-10-CM

## 2016-12-16 LAB — IBC PANEL
IRON: 42 ug/dL (ref 42–145)
Saturation Ratios: 14.3 % — ABNORMAL LOW (ref 20.0–50.0)
TRANSFERRIN: 210 mg/dL — AB (ref 212.0–360.0)

## 2016-12-16 LAB — CBC WITH DIFFERENTIAL/PLATELET
BASOS ABS: 0 10*3/uL (ref 0.0–0.1)
Basophils Relative: 0.6 % (ref 0.0–3.0)
EOS PCT: 2 % (ref 0.0–5.0)
Eosinophils Absolute: 0.1 10*3/uL (ref 0.0–0.7)
HCT: 40 % (ref 36.0–46.0)
HEMOGLOBIN: 12.7 g/dL (ref 12.0–15.0)
LYMPHS ABS: 2.9 10*3/uL (ref 0.7–4.0)
Lymphocytes Relative: 41.2 % (ref 12.0–46.0)
MCHC: 31.8 g/dL (ref 30.0–36.0)
MCV: 83.3 fl (ref 78.0–100.0)
MONO ABS: 0.6 10*3/uL (ref 0.1–1.0)
Monocytes Relative: 7.9 % (ref 3.0–12.0)
NEUTROS PCT: 48.3 % (ref 43.0–77.0)
Neutro Abs: 3.4 10*3/uL (ref 1.4–7.7)
Platelets: 272 10*3/uL (ref 150.0–400.0)
RBC: 4.8 Mil/uL (ref 3.87–5.11)
RDW: 15.2 % (ref 11.5–15.5)
WBC: 7.1 10*3/uL (ref 4.0–10.5)

## 2016-12-16 LAB — B12 AND FOLATE PANEL: VITAMIN B 12: 399 pg/mL (ref 211–911)

## 2016-12-16 LAB — TB SKIN TEST
Induration: 0 mm
TB SKIN TEST: NEGATIVE

## 2016-12-16 LAB — FERRITIN: Ferritin: 49.6 ng/mL (ref 10.0–291.0)

## 2016-12-16 NOTE — Progress Notes (Signed)
Pt presented in the office today for a tb read. The tb test was negative with no induration. Paperwork from school was given to [facility] to be filled out.

## 2017-04-20 IMAGING — US US OB COMP LESS 14 WK
1 series · 15 of 28 positions shown · non-contrast
Comparison: None.

CLINICAL DATA: Bleeding and cramping since 10 p.m.. Estimated
gestational age by LMP is 7 weeks 4 days. Quantitative beta HCG is
pending.

EXAM:
OBSTETRIC <14 WK ULTRASOUND
TECHNIQUE: Transabdominal ultrasound was performed for evaluation of the
gestation as well as the maternal uterus and adnexal regions.

[Series 1: us ob comp less 14 wk · 15 of 37 slices shown]
[im 1/37]
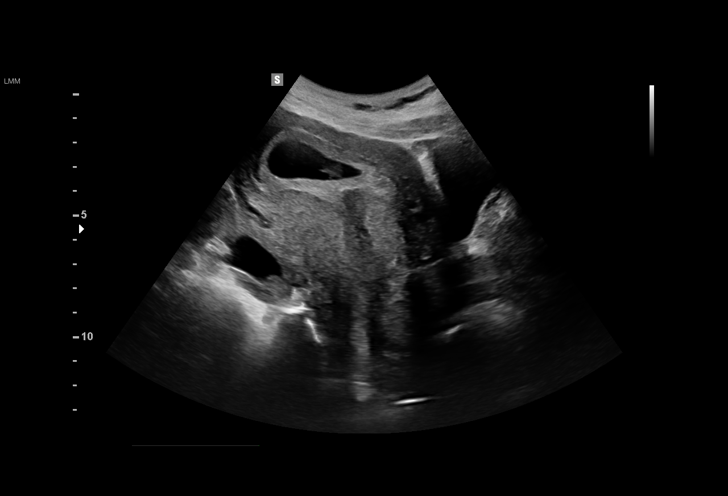
[im 3/37]
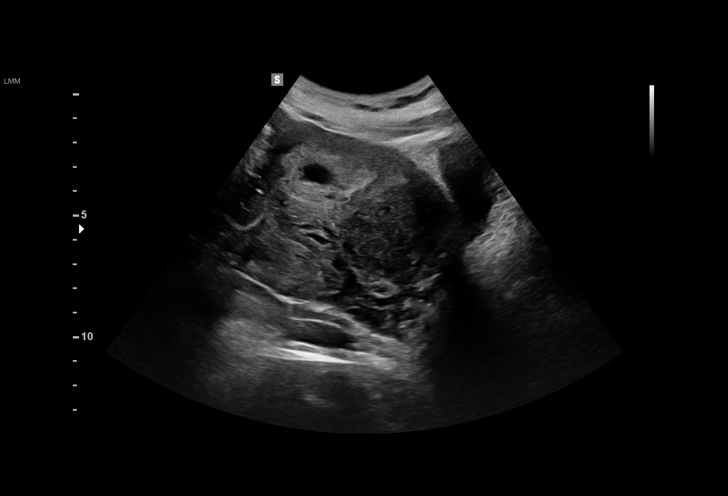
[im 6/37]
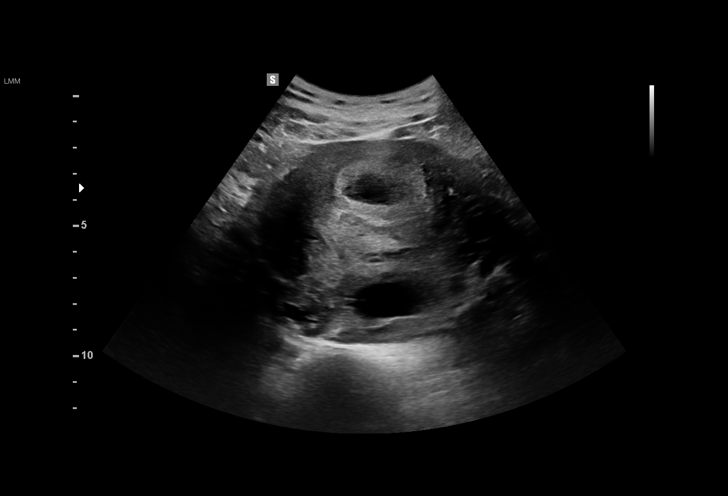
[im 9/37]
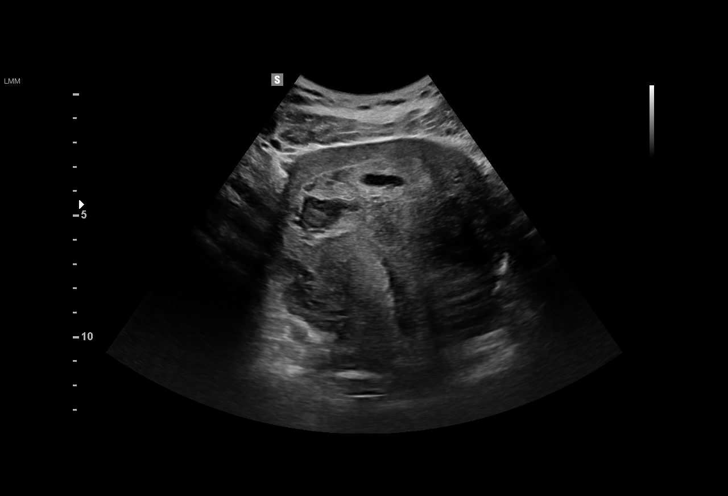
[im 11/37]
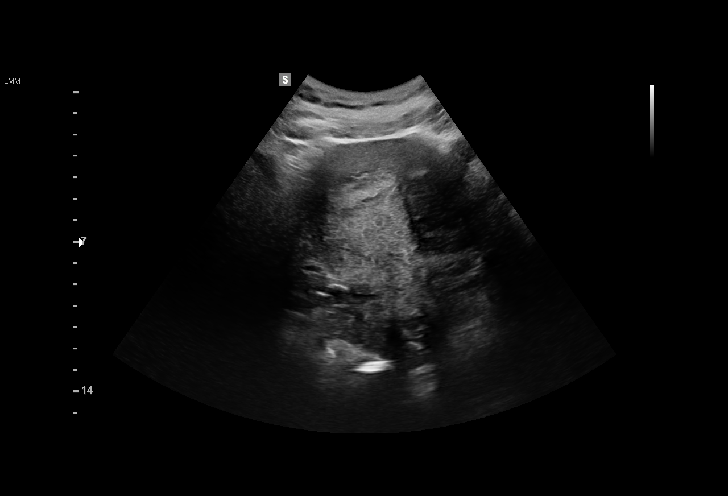
[im 14/37]
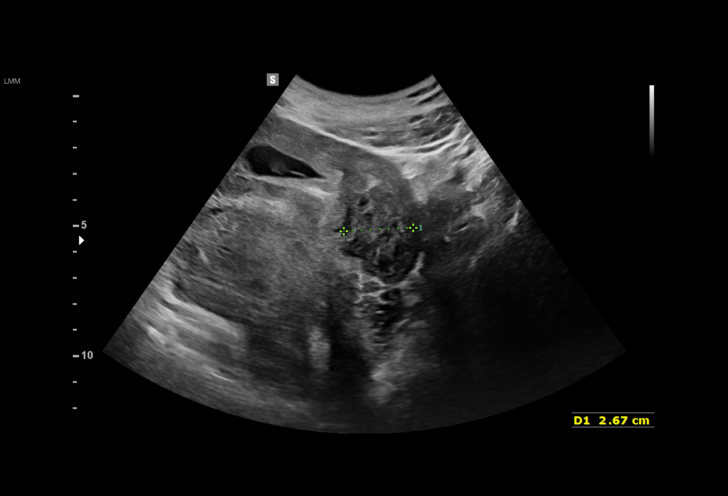
[im 17/37]
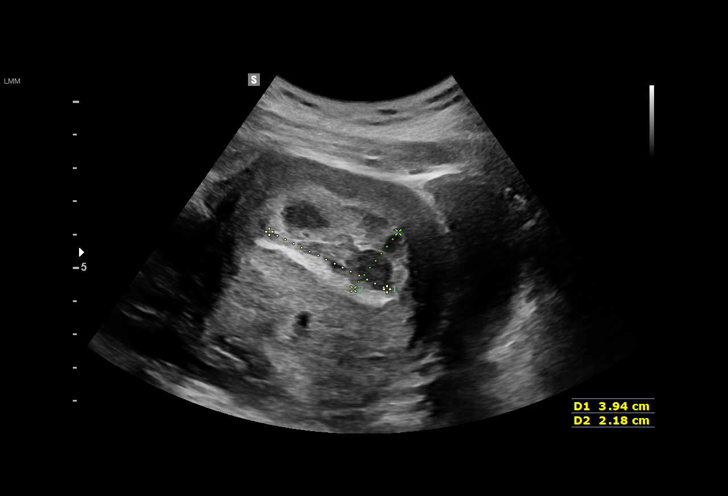
[im 19/37]
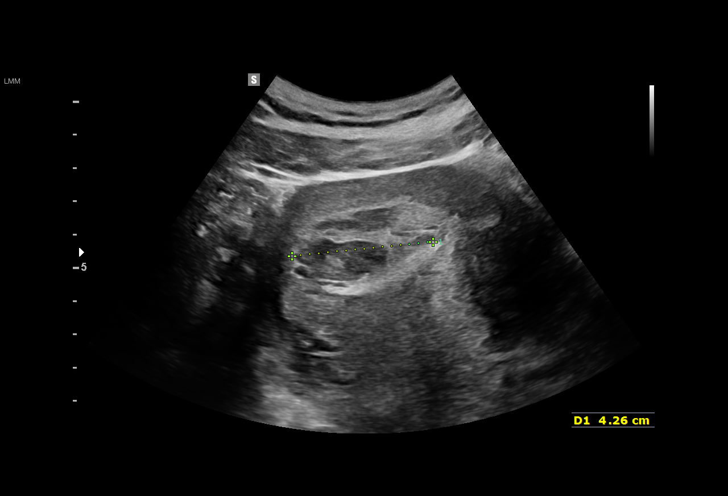
[im 21/37]
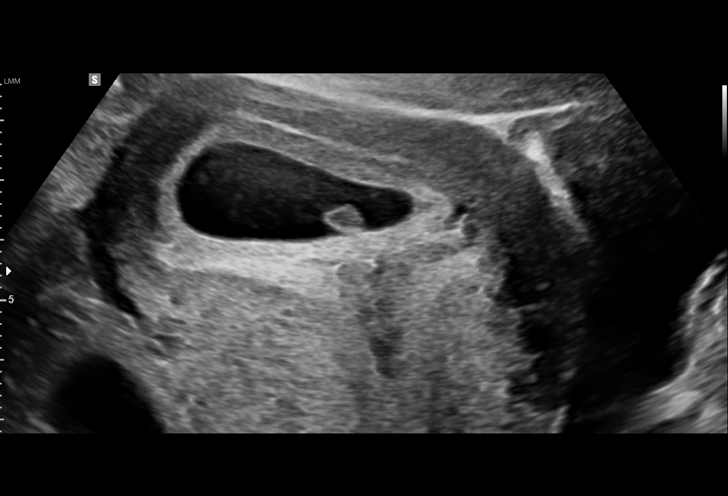
[im 23/37]
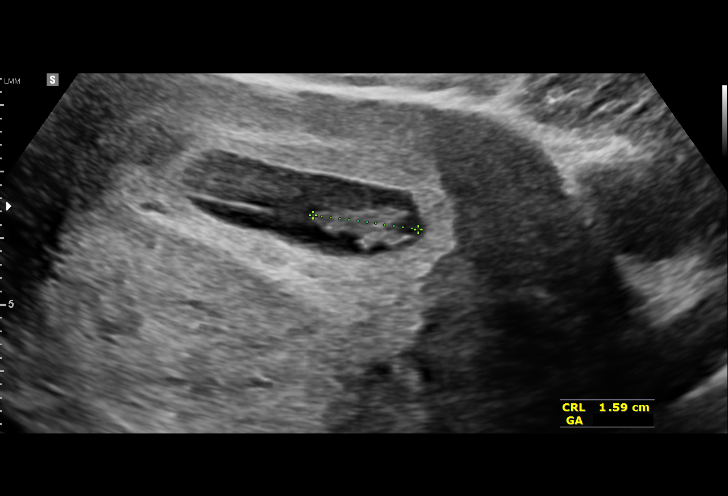
[im 26/37]
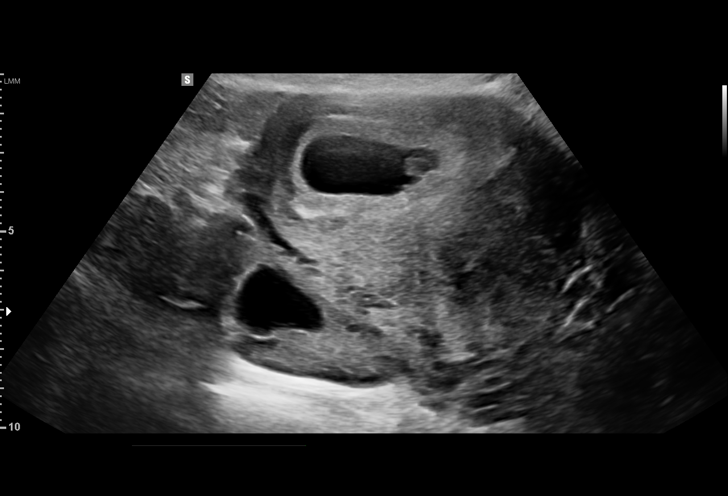
[im 29/37]
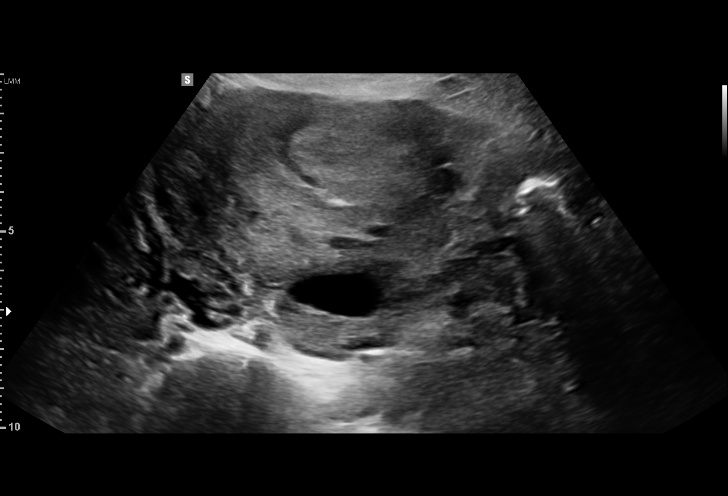
[im 31/37]
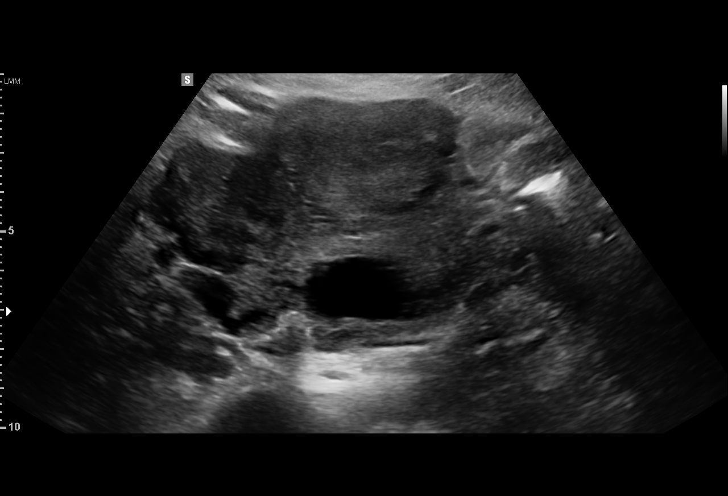
[im 34/37]
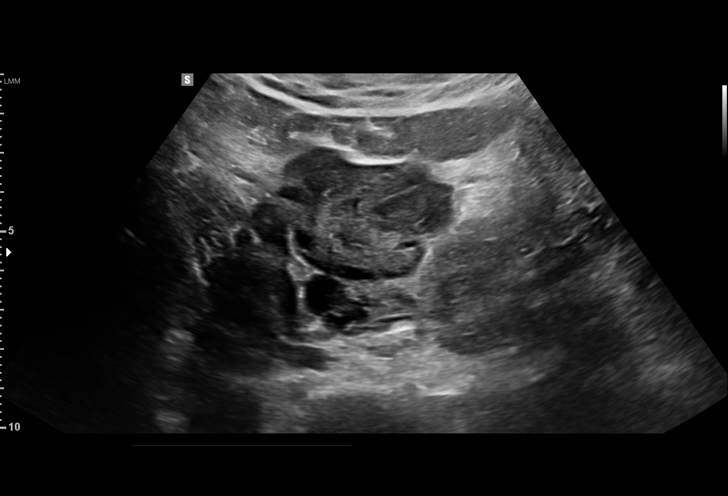
[im 37/37]
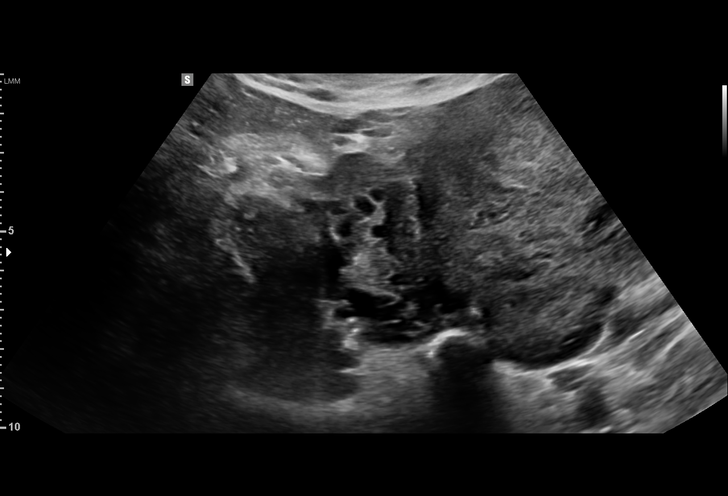

[15 of 28 positions shown; findings below may reference images not displayed]

FINDINGS: Intrauterine gestational sac: Single intrauterine gestational sac is
identified.

Yolk sac:  Yolk sac is present.

Embryo:  Fetal pole is present.

Cardiac Activity: Fetal cardiac activity is identified.

Heart Rate: 165 bpm

CRL:   16  mm   8 W 0 d                  US EDC: 08/16/2016

Subchorionic hemorrhage: A large subchorionic hemorrhage is
identified inferiorly, measuring 3.9 x 2.2 x 4.3 cm.

Maternal uterus/adnexae: Uterus appears anteverted and retroflexed.
There appears to be in intramural fibroid lateral and to the left
measuring about 4.1 cm maximal diameter. Both ovaries are visualized
and appear normal. Corpus luteal cyst on the left. No free fluid.
IMPRESSION: Single intrauterine pregnancy. Estimated gestational age by
crown-rump length is 8 weeks 0 days. Large subchorionic hemorrhages
demonstrated.

## 2017-08-23 NOTE — L&D Delivery Note (Signed)
Delivery Note Pt complete with pelvic pressure. She pushed for approx 38mins and at 9:56 PM a viable female was delivered via Vaginal, Spontaneous (Presentation:OA ;  ).  APGAR: 9, 9; weight pending  .   Placenta status: delivered intactschultz , .  Cord: 3vc with the following complications: nuchal x 1 ; loose .  Cord pH: n/a  Anesthesia:  Epidural Episiotomy: None Lacerations:  none Suture Repair: n/a Est. Blood Loss (mL): 250  Mom to postpartum.  Baby to Couplet care / Skin to Skin.  Isaiah Serge 05/07/2018, 10:09 PM

## 2017-10-02 ENCOUNTER — Ambulatory Visit (HOSPITAL_COMMUNITY)
Admission: EM | Admit: 2017-10-02 | Discharge: 2017-10-02 | Disposition: A | Discharge status: Home / Self Care | Payer: BLUE CROSS/BLUE SHIELD

## 2017-10-05 ENCOUNTER — Encounter (HOSPITAL_COMMUNITY): Payer: Self-pay | Admitting: Emergency Medicine

## 2017-10-05 ENCOUNTER — Other Ambulatory Visit: Payer: Self-pay

## 2017-10-05 ENCOUNTER — Inpatient Hospital Stay (HOSPITAL_COMMUNITY): Payer: BLUE CROSS/BLUE SHIELD

## 2017-10-05 ENCOUNTER — Inpatient Hospital Stay (HOSPITAL_COMMUNITY)
Admission: EM | Admit: 2017-10-05 | Discharge: 2017-10-05 | Discharge status: Against Medical Advice | Payer: BLUE CROSS/BLUE SHIELD | Attending: Emergency Medicine | Admitting: Emergency Medicine

## 2017-10-05 DIAGNOSIS — O200 Threatened abortion: Secondary | ICD-10-CM | POA: Insufficient documentation

## 2017-10-05 DIAGNOSIS — O209 Hemorrhage in early pregnancy, unspecified: Secondary | ICD-10-CM | POA: Diagnosis present

## 2017-10-05 DIAGNOSIS — R109 Unspecified abdominal pain: Secondary | ICD-10-CM | POA: Diagnosis not present

## 2017-10-05 DIAGNOSIS — Z3A08 8 weeks gestation of pregnancy: Secondary | ICD-10-CM | POA: Insufficient documentation

## 2017-10-05 DIAGNOSIS — O26891 Other specified pregnancy related conditions, first trimester: Secondary | ICD-10-CM | POA: Insufficient documentation

## 2017-10-05 DIAGNOSIS — Z5321 Procedure and treatment not carried out due to patient leaving prior to being seen by health care provider: Secondary | ICD-10-CM | POA: Insufficient documentation

## 2017-10-05 DIAGNOSIS — Z3491 Encounter for supervision of normal pregnancy, unspecified, first trimester: Secondary | ICD-10-CM

## 2017-10-05 LAB — URINALYSIS, ROUTINE W REFLEX MICROSCOPIC
Bilirubin Urine: NEGATIVE
Glucose, UA: NEGATIVE mg/dL
Ketones, ur: NEGATIVE mg/dL
Nitrite: NEGATIVE
PROTEIN: 30 mg/dL — AB
Specific Gravity, Urine: 1.028 (ref 1.005–1.030)
pH: 6 (ref 5.0–8.0)

## 2017-10-05 LAB — CBC WITH DIFFERENTIAL/PLATELET
Basophils Absolute: 0 10*3/uL (ref 0.0–0.1)
Basophils Relative: 0 %
EOS ABS: 0.2 10*3/uL (ref 0.0–0.7)
Eosinophils Relative: 2 %
HEMATOCRIT: 38.1 % (ref 36.0–46.0)
HEMOGLOBIN: 12.2 g/dL (ref 12.0–15.0)
LYMPHS PCT: 33 %
Lymphs Abs: 3.1 10*3/uL (ref 0.7–4.0)
MCH: 25.9 pg — AB (ref 26.0–34.0)
MCHC: 32 g/dL (ref 30.0–36.0)
MCV: 80.9 fL (ref 78.0–100.0)
MONO ABS: 0.5 10*3/uL (ref 0.1–1.0)
Monocytes Relative: 5 %
NEUTROS ABS: 5.4 10*3/uL (ref 1.7–7.7)
Neutrophils Relative %: 60 %
Platelets: 209 10*3/uL (ref 150–400)
RBC: 4.71 MIL/uL (ref 3.87–5.11)
RDW: 14.2 % (ref 11.5–15.5)
WBC: 9.2 10*3/uL (ref 4.0–10.5)

## 2017-10-05 LAB — BASIC METABOLIC PANEL
Anion gap: 11 (ref 5–15)
BUN: 7 mg/dL (ref 6–20)
CALCIUM: 8.8 mg/dL — AB (ref 8.9–10.3)
CHLORIDE: 102 mmol/L (ref 101–111)
CO2: 21 mmol/L — AB (ref 22–32)
Creatinine, Ser: 0.66 mg/dL (ref 0.44–1.00)
GFR calc Af Amer: >60 mL/min (ref >60)
GFR calc non Af Amer: >60 mL/min (ref >60)
Glucose, Bld: 94 mg/dL (ref 65–99)
Potassium: 3.7 mmol/L (ref 3.5–5.1)
Sodium: 134 mmol/L — ABNORMAL LOW (ref 135–145)

## 2017-10-05 LAB — HCG, QUANTITATIVE, PREGNANCY: HCG, BETA CHAIN, QUANT, S: 32730 m[IU]/mL — AB (ref <5)

## 2017-10-05 LAB — ABO/RH: ABO/RH(D): O POS

## 2017-10-05 LAB — I-STAT BETA HCG BLOOD, ED (MC, WL, AP ONLY)

## 2017-10-05 NOTE — ED Notes (Signed)
Pt has decided to leave and go to [facility]

## 2017-10-05 NOTE — MAU Note (Signed)
PT  SAYS SHE  WAS  AT  [place] LEFT.   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   SHE STARTED BLEEDING AND CRAMPING AT 0330.   PAD ON - HAD A STREAK  OF DARK  RED/ BROWNISH  BLOOD .

## 2017-10-05 NOTE — MAU Provider Note (Signed)
  History     CSN: [id number]  Arrival date and time: 10/05/17 0407   None     Chief Complaint  Patient presents with  . Vaginal Bleeding    pregnant [redacted] weeks   37 yo G4P1203 at [redacted]w[redacted]d presents with vaginal bleeding that started 3 hours ago. Was seen at [facility] ED and  hcg was 32000. Patient followed by MFM for infertility in the past and IUI this pregnancy. HCG was 1500 on Jan 30.      Past Medical History:  Diagnosis Date  . Pregnant     Past Surgical History:  Procedure Laterality Date  . CESAREAN SECTION N/A 07/22/2016   Procedure: CESAREAN SECTION;  Surgeon: Christophe Louis, MD;  Location: [place];  Service: Obstetrics;  Laterality: N/A;    Family History  Problem Relation Age of Onset  . Colon cancer Neg Hx   . Breast cancer Neg Hx   . Ovarian cancer Neg Hx     Social History   Tobacco Use  . Smoking status: Former Research scientist (life sciences)  . Smokeless tobacco: Never Used  Substance Use Topics  . Alcohol use: No  . Drug use: No    Allergies: No Known Allergies  Medications Prior to Admission  Medication Sig Dispense Refill Last Dose  . Prenatal Vit-Fe Fumarate-FA (PRENATAL MULTIVITAMIN) TABS tablet Take 1 tablet by mouth daily at 12 noon.   10/04/2017 at Unknown time    Review of Systems  Constitutional: Negative for activity change and appetite change.  HENT: Negative for congestion and dental problem.   Eyes: Negative for discharge and itching.  Respiratory: Negative for apnea and chest tightness.   Cardiovascular: Negative for chest pain and leg swelling.  Gastrointestinal: Negative for abdominal distention and abdominal pain.  Endocrine: Negative for cold intolerance and heat intolerance.  Genitourinary: Positive for vaginal bleeding. Negative for difficulty urinating.  Musculoskeletal: Negative for arthralgias and back pain.  Hematological: Negative for adenopathy. Does not bruise/bleed easily.   Physical Exam   Blood pressure 113/68, pulse 87,  temperature 98.7 F (37.1 C), temperature source Oral, resp. rate 18, height 5\' 6"  (1.676 m), weight 86.9 kg (191 lb 8 oz), last menstrual period 08/06/2017, SpO2 100 %, unknown if currently breastfeeding.  Physical Exam  Constitutional: She is oriented to person, place, and time. She appears well-developed and well-nourished.  HENT:  Head: Normocephalic and atraumatic.  Eyes: Conjunctivae are normal. Pupils are equal, round, and reactive to light.  Neck: Normal range of motion. Neck supple.  Cardiovascular: Normal rate and intact distal pulses.  Respiratory: Effort normal. No respiratory distress.  GI: Soft. She exhibits no distension.  Musculoskeletal: Normal range of motion. She exhibits no edema.  Neurological: She is alert and oriented to person, place, and time.  Skin: Skin is warm and dry.    MAU Course  Procedures  MDM Bcg 32000 and US shows IUP with cardiac activity.   Assessment and Plan  1. IUP with cardiac activity 2. Threatened miscarriage- repeat US in 1 week for viability  Yetta Marceaux 10/05/2017, 6:31 AM

## 2017-10-05 NOTE — Discharge Instructions (Signed)
Threatened Miscarriage °A threatened miscarriage is when you have vaginal bleeding during your first 20 weeks of pregnancy but the pregnancy has not ended. Your doctor will do tests to make sure you are still pregnant. The cause of the bleeding may not be known. This condition does not mean your pregnancy will end. It does increase the risk of it ending (complete miscarriage). °Follow these instructions at home: °· Make sure you keep all your doctor visits for prenatal care. °· Get plenty of rest. °· Do not have sex or use tampons if you have vaginal bleeding. °· Do not douche. °· Do not smoke or use drugs. °· Do not drink alcohol. °· Avoid caffeine. °Contact a doctor if: °· You have light bleeding from your vagina. °· You have belly pain or cramping. °· You have a fever. °Get help right away if: °· You have heavy bleeding from your vagina. °· You have clots of blood coming from your vagina. °· You have bad pain or cramps in your low back or belly. °· You have fever, chills, and bad belly pain. °This information is not intended to replace advice given to you by your health care provider. Make sure you discuss any questions you have with your health care provider. °Document Released: 07/22/2008 Document Revised: 01/15/2016 Document Reviewed: 06/05/2013 °Elsevier Interactive Patient Education © 2018 Elsevier Inc. ° °

## 2017-10-05 NOTE — ED Triage Notes (Signed)
Pt states she is [redacted] weeks pregnant started bleeding like one hour ago and is having 5/10 abd pain.

## 2018-02-25 ENCOUNTER — Inpatient Hospital Stay (HOSPITAL_COMMUNITY)
Admission: AD | Admit: 2018-02-25 | Discharge: 2018-02-25 | Disposition: A | Discharge status: Home / Self Care | Source: Ambulatory Visit | Attending: Obstetrics and Gynecology | Admitting: Obstetrics and Gynecology

## 2018-02-25 ENCOUNTER — Encounter (HOSPITAL_COMMUNITY): Payer: Self-pay | Admitting: *Deleted

## 2018-02-25 DIAGNOSIS — O4702 False labor before 37 completed weeks of gestation, second trimester: Secondary | ICD-10-CM

## 2018-02-25 DIAGNOSIS — Z3A26 26 weeks gestation of pregnancy: Secondary | ICD-10-CM | POA: Insufficient documentation

## 2018-02-25 DIAGNOSIS — Z87891 Personal history of nicotine dependence: Secondary | ICD-10-CM | POA: Diagnosis not present

## 2018-02-25 LAB — URINALYSIS, ROUTINE W REFLEX MICROSCOPIC
BACTERIA UA: NONE SEEN
BILIRUBIN URINE: NEGATIVE
GLUCOSE, UA: NEGATIVE mg/dL
KETONES UR: NEGATIVE mg/dL
LEUKOCYTES UA: NEGATIVE
NITRITE: NEGATIVE
PH: 6 (ref 5.0–8.0)
PROTEIN: NEGATIVE mg/dL
Specific Gravity, Urine: 1.006 (ref 1.005–1.030)

## 2018-02-25 LAB — FETAL FIBRONECTIN: Fetal Fibronectin: NEGATIVE

## 2018-02-25 LAB — WET PREP, GENITAL
Clue Cells Wet Prep HPF POC: NONE SEEN
Sperm: NONE SEEN
TRICH WET PREP: NONE SEEN
YEAST WET PREP: NONE SEEN

## 2018-02-25 MED ORDER — LACTATED RINGERS IV SOLN
INTRAVENOUS | Status: DC
  Administered 2018-02-25: 09:00:00 via INTRAVENOUS

## 2018-02-25 MED ORDER — LACTATED RINGERS IV BOLUS
500.0000 mL | Freq: Once | INTRAVENOUS | Status: AC
Start: 2018-02-25 — End: 2018-02-25
  Administered 2018-02-25: 07:00:00 via INTRAVENOUS

## 2018-02-25 MED ORDER — NIFEDIPINE 10 MG PO CAPS
10.0000 mg | ORAL_CAPSULE | ORAL | Status: AC
  Administered 2018-02-25 (×2): 10 mg via ORAL
  Filled 2018-02-25 (×2): qty 1

## 2018-02-25 NOTE — MAU Provider Note (Addendum)
History     CSN: [id number]  Arrival date and time: 02/25/18 6160   First Provider Initiated Contact with Patient 02/25/18 [phone]      Chief Complaint  Patient presents with  . Contractions   [name] is a 37 y.o. [phone] at [redacted]w[redacted]d who presents today with contractions that started around 0400. The contractions woke her from her sleep. She reports that she is feeling them in her lower abdomen and back. She rates her pain 8/10. She has had 2 prior PTBs at 35 weeks due to PPROM. She is not getting 17-p with this pregnancy. She denies any VB or LOF. She denies any complications with this pregnancy. She denies intercourse in the last 24 hours. She reports that the contractions have gotten more regular since starting, but the pain is the same.   Pelvic Pain  The patient's primary symptoms include pelvic pain. The patient's pertinent negatives include no vaginal discharge. This is a new problem. The current episode started today. The problem occurs intermittently. The problem has been gradually worsening. Pain severity now: 8/10. The problem affects both sides. She is pregnant. Pertinent negatives include no chills, dysuria, fever, frequency, nausea or vomiting. Nothing aggravates the symptoms. She has tried nothing for the symptoms.    Past Medical History:  Diagnosis Date  . Pregnant     Past Surgical History:  Procedure Laterality Date  . CESAREAN SECTION N/A 07/22/2016   Procedure: CESAREAN SECTION;  Surgeon: Christophe Louis, MD;  Location: [facility];  Service: Obstetrics;  Laterality: N/A;    Family History  Problem Relation Age of Onset  . Colon cancer Neg Hx   . Breast cancer Neg Hx   . Ovarian cancer Neg Hx     Social History   Tobacco Use  . Smoking status: Former Research scientist (life sciences)  . Smokeless tobacco: Never Used  Substance Use Topics  . Alcohol use: No  . Drug use: No    Allergies: No Known Allergies  Medications Prior to Admission  Medication Sig Dispense Refill Last  Dose  . Prenatal Vit-Fe Fumarate-FA (PRENATAL MULTIVITAMIN) TABS tablet Take 1 tablet by mouth daily at 12 noon.   02/24/2018 at Unknown time    Review of Systems  Constitutional: Negative for chills and fever.  Gastrointestinal: Negative for nausea and vomiting.  Genitourinary: Positive for pelvic pain. Negative for dysuria, frequency, vaginal bleeding and vaginal discharge.   Physical Exam   Blood pressure 124/70, pulse 91, temperature (!) 97.4 F (36.3 C), resp. rate 18, height 5\' 6"  (1.676 m), weight 212 lb (96.2 kg), last menstrual period 08/06/2017, unknown if currently breastfeeding.  Physical Exam  Nursing note and vitals reviewed. Constitutional: She is oriented to person, place, and time. She appears well-developed and well-nourished. No distress.  HENT:  Head: Normocephalic.  Cardiovascular: Normal rate.  Respiratory: Effort normal.  GI: Soft. There is no tenderness. There is no rebound.  Genitourinary:  Genitourinary Comments: Cervix: 1/thick/ballotable   Neurological: She is alert and oriented to person, place, and time.  Skin: Skin is warm and dry.  Psychiatric: She has a normal mood and affect.   NST: 7:17 AM  Baseline: 135 Variability: moderate Accels: 10x10 Decels: none Toco: q 3 mins   MAU Course  Procedures Results for orders placed or performed during the hospital encounter of 02/25/18 (from the past 24 hour(s))  Urinalysis, Routine w reflex microscopic     Status: Abnormal   Collection Time: 02/25/18  6:44 AM  Result Value Ref Range  Color, Urine STRAW (A) YELLOW   APPearance CLEAR CLEAR   Specific Gravity, Urine 1.006 1.005 - 1.030   pH 6.0 5.0 - 8.0   Glucose, UA NEGATIVE NEGATIVE mg/dL   Hgb urine dipstick MODERATE (A) NEGATIVE   Bilirubin Urine NEGATIVE NEGATIVE   Ketones, ur NEGATIVE NEGATIVE mg/dL   Protein, ur NEGATIVE NEGATIVE mg/dL   Nitrite NEGATIVE NEGATIVE   Leukocytes, UA NEGATIVE NEGATIVE   RBC / HPF 0-5 0 - 5 RBC/hpf   WBC, UA  0-5 0 - 5 WBC/hpf   Bacteria, UA NONE SEEN NONE SEEN   Squamous Epithelial / LPF 0-5 0 - 5  Fetal fibronectin     Status: None   Collection Time: 02/25/18  7:13 AM  Result Value Ref Range   Fetal Fibronectin NEGATIVE NEGATIVE  Wet prep, genital     Status: Abnormal   Collection Time: 02/25/18  7:13 AM  Result Value Ref Range   Yeast Wet Prep HPF POC NONE SEEN NONE SEEN   Trich, Wet Prep NONE SEEN NONE SEEN   Clue Cells Wet Prep HPF POC NONE SEEN NONE SEEN   WBC, Wet Prep HPF POC FEW (A) NONE SEEN   Sperm NONE SEEN     MDM Wet prep, GC/CT, UA 1L bolus 3 doses of procardia  8:07 AM care turned over to Jorje Guild, NP Marcille Buffy 8:07 AM 02/25/18   Contractions improved with IVF & procardia. Patient reports resolution of pain. Cervix unchanged & FFN negative.  Reviewed with Dr. Willis Modena. Ok to discharge home. Patient has f/u in office on Monday Assessment and Plan  A: 1. Preterm uterine contractions in second trimester, antepartum   2. [redacted] weeks gestation of pregnancy    P: Discharge home Increase water intake Discussed reasons to return to MAU Keep appt on Monday with OB  Jorje Guild, NP

## 2018-02-25 NOTE — MAU Note (Signed)
Ctxs for about an hour and half. Denies LOF or bleeding. Hx two PTDs.at 35wks

## 2018-02-25 NOTE — MAU Note (Signed)
PT SAYS UC STARTED AT 0430- WOKE  HER.- LAST SEX 2 WEEKS AGO.

## 2018-02-25 NOTE — Discharge Instructions (Signed)
Preterm Labor and Birth Information The normal length of a pregnancy is 39-41 weeks. Preterm labor is when labor starts before 37 completed weeks of pregnancy. What are the risk factors for preterm labor? Preterm labor is more likely to occur in women who:  Have certain infections during pregnancy such as a bladder infection, sexually transmitted infection, or infection inside the uterus (chorioamnionitis).  Have a shorter-than-normal cervix.  Have gone into preterm labor before.  Have had surgery on their cervix.  Are younger than age 89 or older than age 19.  Are African American.  Are pregnant with twins or multiple babies (multiple gestation).  Take street drugs or smoke while pregnant.  Do not gain enough weight while pregnant.  Became pregnant shortly after having been pregnant.  What are the symptoms of preterm labor? Symptoms of preterm labor include:  Cramps similar to those that can happen during a menstrual period. The cramps may happen with diarrhea.  Pain in the abdomen or lower back.  Regular uterine contractions that may feel like tightening of the abdomen.  A feeling of increased pressure in the pelvis.  Increased watery or bloody mucus discharge from the vagina.  Water breaking (ruptured amniotic sac).  Why is it important to recognize signs of preterm labor? It is important to recognize signs of preterm labor because babies who are born prematurely may not be fully developed. This can put them at an increased risk for:  Long-term (chronic) heart and lung problems.  Difficulty immediately after birth with regulating body systems, including blood sugar, body temperature, heart rate, and breathing rate.  Bleeding in the brain.  Cerebral palsy.  Learning difficulties.  Death.  These risks are highest for babies who are born before 37 weeks of pregnancy. How is preterm labor treated? Treatment depends on the length of your pregnancy, your  condition, and the health of your baby. It may involve:  Having a stitch (suture) placed in your cervix to prevent your cervix from opening too early (cerclage).  Taking or being given medicines, such as: ? Hormone medicines. These may be given early in pregnancy to help support the pregnancy. ? Medicine to stop contractions. ? Medicines to help mature the babys lungs. These may be prescribed if the risk of delivery is high. ? Medicines to prevent your baby from developing cerebral palsy.  If the labor happens before 34 weeks of pregnancy, you may need to stay in the hospital. What should I do if I think I am in preterm labor? If you think that you are going into preterm labor, call your health care provider right away. How can I prevent preterm labor in future pregnancies? To increase your chance of having a full-term pregnancy:  Do not use any tobacco products, such as cigarettes, chewing tobacco, and e-cigarettes. If you need help quitting, ask your health care provider.  Do not use street drugs or medicines that have not been prescribed to you during your pregnancy.  Talk with your health care provider before taking any herbal supplements, even if you have been taking them regularly.  Make sure you gain a healthy amount of weight during your pregnancy.  Watch for infection. If you think that you might have an infection, get it checked right away.  Make sure to tell your health care provider if you have gone into preterm labor before.  This information is not intended to replace advice given to you by your health care provider. Make sure you discuss any questions  you have with your health care provider. Document Released: 10/30/2003 Document Revised: 01/20/2016 Document Reviewed: 12/31/2015 Elsevier Interactive Patient Education  2018 [employer].

## 2018-02-27 LAB — GC/CHLAMYDIA PROBE AMP (~~LOC~~) NOT AT ARMC
CHLAMYDIA, DNA PROBE: NEGATIVE
Neisseria Gonorrhea: NEGATIVE

## 2018-03-08 ENCOUNTER — Encounter: Attending: Obstetrics and Gynecology | Admitting: Registered"

## 2018-03-08 DIAGNOSIS — O24410 Gestational diabetes mellitus in pregnancy, diet controlled: Secondary | ICD-10-CM | POA: Diagnosis not present

## 2018-03-08 DIAGNOSIS — O09529 Supervision of elderly multigravida, unspecified trimester: Secondary | ICD-10-CM | POA: Diagnosis not present

## 2018-03-08 DIAGNOSIS — Z3A00 Weeks of gestation of pregnancy not specified: Secondary | ICD-10-CM | POA: Diagnosis not present

## 2018-03-08 DIAGNOSIS — O99810 Abnormal glucose complicating pregnancy: Secondary | ICD-10-CM

## 2018-03-08 DIAGNOSIS — Z713 Dietary counseling and surveillance: Secondary | ICD-10-CM | POA: Diagnosis not present

## 2018-03-13 ENCOUNTER — Encounter: Payer: Self-pay | Admitting: Registered"

## 2018-03-13 DIAGNOSIS — O99810 Abnormal glucose complicating pregnancy: Secondary | ICD-10-CM | POA: Insufficient documentation

## 2018-03-13 NOTE — Progress Notes (Signed)
Patient was seen on 03/08/2018 for Gestational Diabetes self-management class at the [facility]. The following learning objectives were met by the patient during this course:   States the definition of Gestational Diabetes  States why dietary management is important in controlling blood glucose  Describes the effects each nutrient has on blood glucose levels  Demonstrates ability to create a balanced meal plan  Demonstrates carbohydrate counting   States when to check blood glucose levels  Demonstrates proper blood glucose monitoring techniques  States the effect of stress and exercise on blood glucose levels  States the importance of limiting caffeine and abstaining from alcohol and smoking  Blood glucose monitor given: Contour [employer] Lot # [id number] Exp: 03/23/2019 Blood glucose reading: 84  Patient instructed to monitor glucose levels: FBS: 60 - <95; 1 hour: <140; 2 hour: <120  Patient received handouts:  Nutrition Diabetes and Pregnancy, including carb counting list  Patient will be seen for follow-up as needed.

## 2018-03-24 ENCOUNTER — Inpatient Hospital Stay (HOSPITAL_BASED_OUTPATIENT_CLINIC_OR_DEPARTMENT_OTHER)

## 2018-03-24 ENCOUNTER — Encounter (HOSPITAL_COMMUNITY): Payer: Self-pay | Admitting: Obstetrics and Gynecology

## 2018-03-24 ENCOUNTER — Inpatient Hospital Stay (HOSPITAL_COMMUNITY)
Admission: AD | Admit: 2018-03-24 | Discharge: 2018-03-24 | Disposition: A | Discharge status: Home / Self Care | Source: Ambulatory Visit | Attending: Obstetrics and Gynecology | Admitting: Obstetrics and Gynecology

## 2018-03-24 DIAGNOSIS — O368939 Maternal care for other specified fetal problems, third trimester, other fetus: Secondary | ICD-10-CM | POA: Diagnosis not present

## 2018-03-24 DIAGNOSIS — Z87891 Personal history of nicotine dependence: Secondary | ICD-10-CM | POA: Insufficient documentation

## 2018-03-24 DIAGNOSIS — O368130 Decreased fetal movements, third trimester, not applicable or unspecified: Secondary | ICD-10-CM | POA: Insufficient documentation

## 2018-03-24 DIAGNOSIS — O99213 Obesity complicating pregnancy, third trimester: Secondary | ICD-10-CM | POA: Diagnosis not present

## 2018-03-24 DIAGNOSIS — Z3689 Encounter for other specified antenatal screening: Secondary | ICD-10-CM

## 2018-03-24 DIAGNOSIS — Z3A35 35 weeks gestation of pregnancy: Secondary | ICD-10-CM | POA: Insufficient documentation

## 2018-03-24 DIAGNOSIS — O4703 False labor before 37 completed weeks of gestation, third trimester: Secondary | ICD-10-CM | POA: Diagnosis not present

## 2018-03-24 DIAGNOSIS — Z3A30 30 weeks gestation of pregnancy: Secondary | ICD-10-CM | POA: Diagnosis not present

## 2018-03-24 DIAGNOSIS — O09213 Supervision of pregnancy with history of pre-term labor, third trimester: Secondary | ICD-10-CM | POA: Diagnosis not present

## 2018-03-24 DIAGNOSIS — O368190 Decreased fetal movements, unspecified trimester, not applicable or unspecified: Secondary | ICD-10-CM

## 2018-03-24 LAB — FETAL FIBRONECTIN: Fetal Fibronectin: POSITIVE — AB

## 2018-03-24 MED ORDER — NIFEDIPINE 10 MG PO CAPS
10.0000 mg | ORAL_CAPSULE | Freq: Once | ORAL | Status: AC
  Administered 2018-03-24: 10 mg via ORAL
  Filled 2018-03-24: qty 1

## 2018-03-24 MED ORDER — NIFEDIPINE 10 MG PO CAPS: 10.0000 mg | ORAL_CAPSULE | Freq: Four times a day (QID) | ORAL | 0 refills | Status: DC | PRN

## 2018-03-24 MED ORDER — BETAMETHASONE SOD PHOS & ACET 6 (3-3) MG/ML IJ SUSP
12.0000 mg | Freq: Once | INTRAMUSCULAR | Status: AC
  Administered 2018-03-24: 12 mg via INTRAMUSCULAR
  Filled 2018-03-24: qty 2

## 2018-03-24 NOTE — MAU Note (Signed)
Urine in labs

## 2018-03-24 NOTE — MAU Provider Note (Signed)
History     CSN: [id number]  Arrival date and time: 03/24/18 6789   First Provider Initiated Contact with Patient 03/24/18 2000      Chief Complaint  Patient presents with  . Decreased Fetal Movement   HPI   Ms.Henriette Hesser is a 37 y.o. female [phone] @ [redacted]w[redacted]d here with decreased fetal movement and contractions. Last night she was up all night with contractions, today the contractions slowed down a lot. She denies pain at this time.  Hx of 2 preterm delivery's at 35 weeks. No bleeding, no leaking. + fetal movement now. Did not take anything for the pain.   OB History    Gravida  4   Para  3   Term  1   Preterm  2   AB      Living  3     SAB      TAB      Ectopic      Multiple  0   Live Births  3           Past Medical History:  Diagnosis Date  . Pregnant     Past Surgical History:  Procedure Laterality Date  . CESAREAN SECTION N/A 07/22/2016   Procedure: CESAREAN SECTION;  Surgeon: Christophe Louis, MD;  Location: [facility];  Service: Obstetrics;  Laterality: N/A;    Family History  Problem Relation Age of Onset  . Colon cancer Neg Hx   . Breast cancer Neg Hx   . Ovarian cancer Neg Hx     Social History   Tobacco Use  . Smoking status: Former Research scientist (life sciences)  . Smokeless tobacco: Never Used  Substance Use Topics  . Alcohol use: No  . Drug use: No    Allergies: No Known Allergies  Medications Prior to Admission  Medication Sig Dispense Refill Last Dose  . Prenatal Vit-Fe Fumarate-FA (PRENATAL MULTIVITAMIN) TABS tablet Take 1 tablet by mouth daily at 12 noon.   02/24/2018 at Unknown time   Results for orders placed or performed during the hospital encounter of 03/24/18 (from the past 48 hour(s))  Fetal fibronectin     Status: Abnormal   Collection Time: 03/24/18  8:12 PM  Result Value Ref Range   Fetal Fibronectin POSITIVE (A) NEGATIVE    Comment: Performed at [facility], [address], [place], [place] [address]   No results  found.  Review of Systems  Constitutional: Negative for fever.  Gastrointestinal: Negative for abdominal pain.  Genitourinary: Negative for vaginal bleeding, vaginal discharge and vaginal pain.  Neurological: Negative for dizziness.   Physical Exam   Blood pressure 116/64, pulse 95, temperature 98.3 F (36.8 C), temperature source Oral, resp. rate 18, height 5\' 6"  (1.676 m), weight 211 lb (95.7 kg), last menstrual period 08/06/2017, unknown if currently breastfeeding.  Physical Exam  Constitutional: She is oriented to person, place, and time. She appears well-developed and well-nourished. No distress.  HENT:  Head: Normocephalic.  Eyes: Pupils are equal, round, and reactive to light.  GI: Soft. She exhibits no distension. There is no tenderness.  Genitourinary:  Genitourinary Comments: Dilation: 1.5 Effacement (%): Thick Cervical Position: Posterior Station: -3 Presentation: Vertex Fetal fibronectin collected.  Exam by:: Eliezer Lofts  Musculoskeletal: Normal range of motion.  Neurological: She is alert and oriented to person, place, and time.  Skin: Skin is warm. She is not diaphoretic.  Psychiatric: Her behavior is normal.   Fetal Tracing: Baseline: 120 bpm Variability: Moderate  Accelerations: 15x15 Decelerations: variable  Toco: occasional, irregular pattern   MAU Course  Procedures  None  MDM  Fetal fibronectin collected and sent + BPP ordered 6/8 with reactive NST 8/10. -2 points for breathing  UA Betamethasone given tonight.  Discussed results and plan in detail with patient Discussed HPI, labs, [place] with Dr. Terri Piedra; ok for DC home with close f/u  Assessment and Plan   A:  1. Threatened premature labor in third trimester   2. Decreased fetal movement   3. [redacted] weeks gestation of pregnancy   4. NST (non-stress test) reactive     P:  Discharge home with strict return precautions Fetal kick counts Return to MAU tomorrow for 2nd betamethasone Preterm  labor precautions Pelvic rest  Quantisha Marsicano, Artist Pais, NP 03/25/2018 8:11 AM

## 2018-03-24 NOTE — Discharge Instructions (Signed)
Preventing Preterm Birth Preterm birth is when your baby is delivered between 54 weeks and 37 weeks of pregnancy. A full-term pregnancy lasts for at least 37 weeks. Preterm birth can be dangerous for your baby because the last few weeks of pregnancy are an important time for your baby's brain and lungs to grow. Many things can cause a baby to be born early. Sometimes the cause is not known. There are certain factors that make you more likely to experience preterm birth, such as:  Having a previous baby born preterm.  Being pregnant with twins or other multiples.  Having had fertility treatment.  Being overweight or underweight at the start of your pregnancy.  Having any of the following during pregnancy: ? An infection, including a urinary tract infection (UTI) or an STI (sexually transmitted infection). ? High blood pressure. ? Diabetes. ? Vaginal bleeding.  Being age 24 or older.  Being age 75 or younger.  Getting pregnant within 6 months of a previous pregnancy.  Suffering extreme stress or physical or emotional abuse during pregnancy.  Standing for long periods of time during pregnancy, such as working at a job that requires standing.  What are the risks? The most serious risk of preterm birth is that the baby may not survive. This is more likely to happen if a baby is born before 42 weeks. Other risks and complications of preterm birth may include your baby having:  Breathing problems.  Brain damage that affects movement and coordination (cerebral palsy).  Feeding difficulties.  Vision or hearing problems.  Infections or inflammation of the digestive tract (colitis).  Developmental delays.  Learning disabilities.  Higher risk for diabetes, heart disease, and high blood pressure later in life.  What can I do to lower my risk? Medical care  The most important thing you can do to lower your risk for preterm birth is to get routine medical care during pregnancy  (prenatal care). If you have a high risk of preterm birth, you may be referred to a health care provider who specializes in managing high-risk pregnancies (perinatologist). You may be given medicine to help prevent preterm birth. Lifestyle changes Certain lifestyle changes can also lower your risk of preterm birth:  Wait at least 6 months after a pregnancy to become pregnant again.  Try to plan pregnancy for when you are between 35 and 85 years old.  Get to a healthy weight before getting pregnant. If you are overweight, work with your health care provider to safely lose weight.  Do not use any products that contain nicotine or tobacco, such as cigarettes and e-cigarettes. If you need help quitting, ask your health care provider.  Do not drink alcohol.  Do not use drugs.  Where to find support: For more support, consider:  Talking with your health care provider.  Talking with a therapist or substance abuse counselor, if you need help quitting.  Working with a diet and nutrition specialist (dietitian) or a Physiological scientist to maintain a healthy weight.  Joining a support group.  Where to find more information: Learn more about preventing preterm birth from:  Centers for Disease Control and Prevention: VoipObserver.com.br  March of Dimes: marchofdimes.org/complications/premature-babies.aspx  American Pregnancy Association: americanpregnancy.org/labor-and-birth/premature-labor  Contact a health care provider if:  You have any of the following signs of preterm labor before 37 weeks: ? A change or increase in vaginal discharge. ? Fluid leaking from your vagina. ? Pressure or cramps in your lower abdomen. ? A backache that does not  go away or gets worse. ? Regular tightening (contractions) in your lower abdomen. Summary  Preterm birth means having your baby during weeks 56-37 of pregnancy.  Preterm birth may put your baby at risk  for physical and mental problems.  Getting good prenatal care can help prevent preterm birth.  You can lower your risk of preterm birth by making certain lifestyle changes, such as not smoking and not using alcohol. This information is not intended to replace advice given to you by your health care provider. Make sure you discuss any questions you have with your health care provider. Document Released: 09/23/2015 Document Revised: 04/17/2016 Document Reviewed: 04/17/2016 Elsevier Interactive Patient Education  [employer].

## 2018-03-24 NOTE — MAU Note (Signed)
Pt stated she had some ctx last night lasted over 2 hrs. finally went away. Had a few more this morning but Baby has not moved much since.

## 2018-03-25 ENCOUNTER — Inpatient Hospital Stay (HOSPITAL_COMMUNITY)
Admission: AD | Admit: 2018-03-25 | Discharge: 2018-03-25 | Disposition: A | Discharge status: Home / Self Care | Source: Ambulatory Visit | Attending: Obstetrics and Gynecology | Admitting: Obstetrics and Gynecology

## 2018-03-25 DIAGNOSIS — O34219 Maternal care for unspecified type scar from previous cesarean delivery: Secondary | ICD-10-CM | POA: Diagnosis not present

## 2018-03-25 DIAGNOSIS — Z3A30 30 weeks gestation of pregnancy: Secondary | ICD-10-CM | POA: Insufficient documentation

## 2018-03-25 DIAGNOSIS — Z87891 Personal history of nicotine dependence: Secondary | ICD-10-CM | POA: Insufficient documentation

## 2018-03-25 DIAGNOSIS — O09523 Supervision of elderly multigravida, third trimester: Secondary | ICD-10-CM | POA: Diagnosis not present

## 2018-03-25 DIAGNOSIS — Z3689 Encounter for other specified antenatal screening: Secondary | ICD-10-CM | POA: Diagnosis not present

## 2018-03-25 MED ORDER — BETAMETHASONE SOD PHOS & ACET 6 (3-3) MG/ML IJ SUSP
12.0000 mg | Freq: Once | INTRAMUSCULAR | Status: AC
  Administered 2018-03-25: 12 mg via INTRAMUSCULAR
  Filled 2018-03-25: qty 2

## 2018-03-25 NOTE — MAU Note (Signed)
Pt here for second dose of betamethasone. Reported eh has not need to take anymore procardia today. Last dose was what we gave her last night. But fetal movement is still not as much as it usually is.

## 2018-03-25 NOTE — MAU Provider Note (Signed)
  History     CSN: [id number]  Arrival date and time: 03/25/18 2046   First Provider Initiated Contact with Patient 03/25/18 2207      Chief Complaint  Patient presents with  . Injections   HPI  Leahanna Buser is a 37 y.o. [phone] at [redacted]w[redacted]d who presents to MAU to receive her second Betamethasone injection. Denies pain, vaginal bleeding, leaking of fluid, decreased fetal movement, fever, falls, or recent illness.  Reports she has not had to take her Procardia today.   OB History    Gravida  4   Para  3   Term  1   Preterm  2   AB      Living  3     SAB      TAB      Ectopic      Multiple  0   Live Births  3           Past Medical History:  Diagnosis Date  . Pregnant     Past Surgical History:  Procedure Laterality Date  . CESAREAN SECTION N/A 07/22/2016   Procedure: CESAREAN SECTION;  Surgeon: Christophe Louis, MD;  Location: [facility];  Service: Obstetrics;  Laterality: N/A;    Family History  Problem Relation Age of Onset  . Colon cancer Neg Hx   . Breast cancer Neg Hx   . Ovarian cancer Neg Hx     Social History   Tobacco Use  . Smoking status: Former Research scientist (life sciences)  . Smokeless tobacco: Never Used  Substance Use Topics  . Alcohol use: No  . Drug use: No    Allergies: No Known Allergies  Medications Prior to Admission  Medication Sig Dispense Refill Last Dose  . NIFEdipine (PROCARDIA) 10 MG capsule Take 1 capsule (10 mg total) by mouth every 6 (six) hours as needed. 30 capsule 0   . Prenatal Vit-Fe Fumarate-FA (PRENATAL MULTIVITAMIN) TABS tablet Take 1 tablet by mouth daily at 12 noon.   02/24/2018 at Unknown time    Review of Systems  Constitutional: Negative for fatigue and fever.  Gastrointestinal: Negative for abdominal pain.  Genitourinary: Negative for vaginal bleeding.  Musculoskeletal: Negative for back pain.  Neurological: Negative for headaches.  All other systems reviewed and are negative.  Physical Exam   Last menstrual  period 08/06/2017, unknown if currently breastfeeding.  Physical Exam  Nursing note and vitals reviewed. Constitutional: She appears well-developed and well-nourished.  Cardiovascular: Normal rate, regular rhythm, normal heart sounds and intact distal pulses.  Respiratory: Effort normal and breath sounds normal.  GI:  Gravid  Genitourinary: Vagina normal and uterus normal.  Genitourinary Comments: SVE: 1.5/thick/-3 unchanged from yesterday    MAU Course  Procedures  MDM --No cervical change --Not using Procardia to manage pain --Reactive NST: baseline 135/140, moderate variability, positive accels, no decels --Toco: quiet, occasional uterine irritability  Assessment and Plan  --37 y.o. [id number] at [redacted]w[redacted]d  --no cervical change from yesterday --reactive NST --Betamethasone 2 of 2 administered tonight in MAU  Presentation, clinical findings, and plan discussed with Dr. Terri Piedra. Stable for discharge home.' Darlina Rumpf 03/25/2018, 10:15 PM

## 2018-03-25 NOTE — Discharge Instructions (Signed)

## 2018-04-28 ENCOUNTER — Inpatient Hospital Stay (HOSPITAL_COMMUNITY)
Admission: AD | Admit: 2018-04-28 | Discharge: 2018-04-28 | Disposition: A | Discharge status: Home / Self Care | Source: Ambulatory Visit | Attending: Obstetrics and Gynecology | Admitting: Obstetrics and Gynecology

## 2018-04-28 ENCOUNTER — Encounter (HOSPITAL_COMMUNITY): Payer: Self-pay | Admitting: *Deleted

## 2018-04-28 ENCOUNTER — Other Ambulatory Visit: Payer: Self-pay

## 2018-04-28 DIAGNOSIS — O4700 False labor before 37 completed weeks of gestation, unspecified trimester: Secondary | ICD-10-CM

## 2018-04-28 DIAGNOSIS — Z3A35 35 weeks gestation of pregnancy: Secondary | ICD-10-CM | POA: Diagnosis not present

## 2018-04-28 DIAGNOSIS — Z87891 Personal history of nicotine dependence: Secondary | ICD-10-CM | POA: Insufficient documentation

## 2018-04-28 DIAGNOSIS — O34219 Maternal care for unspecified type scar from previous cesarean delivery: Secondary | ICD-10-CM | POA: Insufficient documentation

## 2018-04-28 DIAGNOSIS — O09523 Supervision of elderly multigravida, third trimester: Secondary | ICD-10-CM | POA: Diagnosis not present

## 2018-04-28 DIAGNOSIS — O4703 False labor before 37 completed weeks of gestation, third trimester: Secondary | ICD-10-CM | POA: Insufficient documentation

## 2018-04-28 DIAGNOSIS — R109 Unspecified abdominal pain: Secondary | ICD-10-CM | POA: Diagnosis present

## 2018-04-28 DIAGNOSIS — O479 False labor, unspecified: Secondary | ICD-10-CM

## 2018-04-28 NOTE — MAU Provider Note (Signed)
History     CSN: [id number]  Arrival date and time: 04/28/18 1803   First Provider Initiated Contact with Patient 04/28/18 1942      Chief Complaint  Patient presents with  . Contractions   HPI [name] is a 37 y.o. [phone] at [redacted]w[redacted]d who presents with contractions. She states for the last 4 hours, she has had regular painful contractions. She has a history of 2 previous preterm births and was worried this may be labor. Denies any leaking or bleeding. Reports normal fetal movement. Also reports an increase in pelvic pain when walking.   OB History    Gravida  4   Para  3   Term  1   Preterm  2   AB      Living  3     SAB      TAB      Ectopic      Multiple  0   Live Births  3           Past Medical History:  Diagnosis Date  . Pregnant     Past Surgical History:  Procedure Laterality Date  . CESAREAN SECTION N/A 07/22/2016   Procedure: CESAREAN SECTION;  Surgeon: Christophe Louis, MD;  Location: [place];  Service: Obstetrics;  Laterality: N/A;    Family History  Problem Relation Age of Onset  . Colon cancer Neg Hx   . Breast cancer Neg Hx   . Ovarian cancer Neg Hx     Social History   Tobacco Use  . Smoking status: Former Research scientist (life sciences)  . Smokeless tobacco: Never Used  Substance Use Topics  . Alcohol use: No  . Drug use: No    Allergies: No Known Allergies  Medications Prior to Admission  Medication Sig Dispense Refill Last Dose  . NIFEdipine (PROCARDIA) 10 MG capsule Take 1 capsule (10 mg total) by mouth every 6 (six) hours as needed. 30 capsule 0   . Prenatal Vit-Fe Fumarate-FA (PRENATAL MULTIVITAMIN) TABS tablet Take 1 tablet by mouth daily at 12 noon.   02/24/2018 at Unknown time    Review of Systems  Constitutional: Negative.  Negative for fatigue and fever.  HENT: Negative.   Respiratory: Negative.  Negative for shortness of breath.   Cardiovascular: Negative.  Negative for chest pain.  Gastrointestinal: Positive for abdominal  pain. Negative for constipation, diarrhea, nausea and vomiting.  Genitourinary: Negative.  Negative for dysuria, vaginal bleeding and vaginal discharge.  Neurological: Negative.  Negative for dizziness and headaches.   Physical Exam   Blood pressure 137/77, pulse 95, temperature 98.4 F (36.9 C), temperature source Oral, resp. rate 20, last menstrual period 08/06/2017, SpO2 100 %, unknown if currently breastfeeding.  Physical Exam  Nursing note and vitals reviewed. Constitutional: She is oriented to person, place, and time. She appears well-developed and well-nourished. No distress.  HENT:  Head: Normocephalic.  Eyes: Pupils are equal, round, and reactive to light.  Cardiovascular: Normal rate, regular rhythm and normal heart sounds.  Respiratory: Effort normal and breath sounds normal. No respiratory distress.  GI: Soft. Bowel sounds are normal. She exhibits no distension. There is no tenderness.  Neurological: She is alert and oriented to person, place, and time.  Skin: Skin is warm and dry.  Psychiatric: She has a normal mood and affect. Her behavior is normal. Judgment and thought content normal.   Dilation: 4 Effacement (%): 60 Cervical Position: Posterior Station: -3 Presentation: Vertex Exam by:: Len Blalock CNM  Fetal Tracing:  Baseline: 125 Variability: moderate Accels: 15x15 Decels: none  Toco: q8-10 with ui  MAU Course  Procedures  MDM Prenatal records from private office reviewed. Pregnancy complicated by previous c/s and 2 prior preterm deliveries. Labs ordered and reviewed.  Cervix unchanged after 1.5 hours Consulted with Dr. Melba Coon- offer to let patient be rechecked in another hour or discharge home. Patient desires to go home.   Assessment and Plan   1. Preterm contractions   2. [redacted] weeks gestation of pregnancy    -Discharge home in stable condition -Preterm labor precautions discussed -Patient advised to follow-up with [facility] as  scheduled for prenatal care -Patient may return to MAU as needed or if her condition were to change or worsen  [place] 04/28/2018, 7:42 PM

## 2018-04-28 NOTE — MAU Note (Signed)
Pt presents with c/o ctxs that began 3 hours ago.  Denies LOF or VB.  Reports +FM. Seen yesterday in office, cervix 4cms & vertex.

## 2018-04-28 NOTE — Discharge Instructions (Signed)
Braxton Hicks Contractions °Contractions of the uterus can occur throughout pregnancy, but they are not always a sign that you are in labor. You may have practice contractions called Braxton Hicks contractions. These false labor contractions are sometimes confused with true labor. °What are Braxton Hicks contractions? °Braxton Hicks contractions are tightening movements that occur in the muscles of the uterus before labor. Unlike true labor contractions, these contractions do not result in opening (dilation) and thinning of the cervix. Toward the end of pregnancy (32-34 weeks), Braxton Hicks contractions can happen more often and may become stronger. These contractions are sometimes difficult to tell apart from true labor because they can be very uncomfortable. You should not feel embarrassed if you go to the hospital with false labor. °Sometimes, the only way to tell if you are in true labor is for your health care provider to look for changes in the cervix. The health care provider will do a physical exam and may monitor your contractions. If you are not in true labor, the exam should show that your cervix is not dilating and your water has not broken. °If there are other health problems associated with your pregnancy, it is completely safe for you to be sent home with false labor. You may continue to have Braxton Hicks contractions until you go into true labor. °How to tell the difference between true labor and false labor °True labor °· Contractions last 30-70 seconds. °· Contractions become very regular. °· Discomfort is usually felt in the top of the uterus, and it spreads to the lower abdomen and low back. °· Contractions do not go away with walking. °· Contractions usually become more intense and increase in frequency. °· The cervix dilates and gets thinner. °False labor °· Contractions are usually shorter and not as strong as true labor contractions. °· Contractions are usually irregular. °· Contractions  are often felt in the front of the lower abdomen and in the groin. °· Contractions may go away when you walk around or change positions while lying down. °· Contractions get weaker and are shorter-lasting as time goes on. °· The cervix usually does not dilate or become thin. °Follow these instructions at home: °· Take over-the-counter and prescription medicines only as told by your health care provider. °· Keep up with your usual exercises and follow other instructions from your health care provider. °· Eat and drink lightly if you think you are going into labor. °· If Braxton Hicks contractions are making you uncomfortable: °? Change your position from lying down or resting to walking, or change from walking to resting. °? Sit and rest in a tub of warm water. °? Drink enough fluid to keep your urine pale yellow. Dehydration may cause these contractions. °? Do slow and deep breathing several times an hour. °· Keep all follow-up prenatal visits as told by your health care provider. This is important. °Contact a health care provider if: °· You have a fever. °· You have continuous pain in your abdomen. °Get help right away if: °· Your contractions become stronger, more regular, and closer together. °· You have fluid leaking or gushing from your vagina. °· You pass blood-tinged mucus (bloody show). °· You have bleeding from your vagina. °· You have low back pain that you never had before. °· You feel your baby’s head pushing down and causing pelvic pressure. °· Your baby is not moving inside you as much as it used to. °Summary °· Contractions that occur before labor are called Braxton   Hicks contractions, false labor, or practice contractions. °· Braxton Hicks contractions are usually shorter, weaker, farther apart, and less regular than true labor contractions. True labor contractions usually become progressively stronger and regular and they become more frequent. °· Manage discomfort from Braxton Hicks contractions by  changing position, resting in a warm bath, drinking plenty of water, or practicing deep breathing. °This information is not intended to replace advice given to you by your health care provider. Make sure you discuss any questions you have with your health care provider. °Document Released: 12/23/2016 Document Revised: 12/23/2016 Document Reviewed: 12/23/2016 °Elsevier Interactive Patient Education © 2018 [employer] ° °

## 2018-05-07 ENCOUNTER — Inpatient Hospital Stay (HOSPITAL_COMMUNITY): Admitting: Anesthesiology

## 2018-05-07 ENCOUNTER — Other Ambulatory Visit: Payer: Self-pay

## 2018-05-07 ENCOUNTER — Inpatient Hospital Stay (HOSPITAL_COMMUNITY)
Admission: AD | Admit: 2018-05-07 | Discharge: 2018-05-09 | DRG: 807 | Disposition: A | Discharge status: Home / Self Care | Attending: Obstetrics and Gynecology | Admitting: Obstetrics and Gynecology

## 2018-05-07 ENCOUNTER — Encounter (HOSPITAL_COMMUNITY): Payer: Self-pay

## 2018-05-07 DIAGNOSIS — Z3483 Encounter for supervision of other normal pregnancy, third trimester: Secondary | ICD-10-CM | POA: Diagnosis present

## 2018-05-07 DIAGNOSIS — O34219 Maternal care for unspecified type scar from previous cesarean delivery: Secondary | ICD-10-CM | POA: Diagnosis present

## 2018-05-07 DIAGNOSIS — Z3A37 37 weeks gestation of pregnancy: Secondary | ICD-10-CM | POA: Diagnosis not present

## 2018-05-07 DIAGNOSIS — Z3490 Encounter for supervision of normal pregnancy, unspecified, unspecified trimester: Secondary | ICD-10-CM

## 2018-05-07 DIAGNOSIS — Z87891 Personal history of nicotine dependence: Secondary | ICD-10-CM

## 2018-05-07 DIAGNOSIS — O24420 Gestational diabetes mellitus in childbirth, diet controlled: Secondary | ICD-10-CM | POA: Diagnosis present

## 2018-05-07 LAB — TYPE AND SCREEN
ABO/RH(D): O POS
Antibody Screen: NEGATIVE

## 2018-05-07 LAB — CBC
HEMATOCRIT: 36.2 % (ref 36.0–46.0)
Hemoglobin: 12.1 g/dL (ref 12.0–15.0)
MCH: 28.3 pg (ref 26.0–34.0)
MCHC: 33.4 g/dL (ref 30.0–36.0)
MCV: 84.6 fL (ref 78.0–100.0)
Platelets: 162 10*3/uL (ref 150–400)
RBC: 4.28 MIL/uL (ref 3.87–5.11)
RDW: 15.1 % (ref 11.5–15.5)
WBC: 9.4 10*3/uL (ref 4.0–10.5)

## 2018-05-07 MED ORDER — LACTATED RINGERS IV SOLN
INTRAVENOUS | Status: DC
  Administered 2018-05-07: 21:00:00 via INTRAVENOUS

## 2018-05-07 MED ORDER — OXYCODONE-ACETAMINOPHEN 5-325 MG PO TABS: 1.0000 | ORAL_TABLET | ORAL | Status: DC | PRN

## 2018-05-07 MED ORDER — EPHEDRINE 5 MG/ML INJ: 10.0000 mg | INTRAVENOUS | Status: DC | PRN

## 2018-05-07 MED ORDER — LACTATED RINGERS IV SOLN: 500.0000 mL | INTRAVENOUS | Status: DC | PRN

## 2018-05-07 MED ORDER — IBUPROFEN 600 MG PO TABS
600.0000 mg | ORAL_TABLET | Freq: Four times a day (QID) | ORAL | Status: DC
  Administered 2018-05-07 – 2018-05-09 (×7): 600 mg via ORAL
  Filled 2018-05-07 (×6): qty 1

## 2018-05-07 MED ORDER — PHENYLEPHRINE 40 MCG/ML (10ML) SYRINGE FOR IV PUSH (FOR BLOOD PRESSURE SUPPORT): 80.0000 ug | PREFILLED_SYRINGE | INTRAVENOUS | Status: DC | PRN

## 2018-05-07 MED ORDER — LIDOCAINE HCL (PF) 1 % IJ SOLN
30.0000 mL | INTRAMUSCULAR | Status: DC | PRN
  Filled 2018-05-07: qty 30

## 2018-05-07 MED ORDER — FENTANYL 2.5 MCG/ML BUPIVACAINE 1/10 % EPIDURAL INFUSION (WH - ANES)
INTRAMUSCULAR | Status: AC
  Filled 2018-05-07: qty 100

## 2018-05-07 MED ORDER — SOD CITRATE-CITRIC ACID 500-334 MG/5ML PO SOLN: 30.0000 mL | ORAL | Status: DC | PRN

## 2018-05-07 MED ORDER — OXYTOCIN BOLUS FROM INFUSION
500.0000 mL | Freq: Once | INTRAVENOUS | Status: AC
  Administered 2018-05-07: 500 mL via INTRAVENOUS

## 2018-05-07 MED ORDER — LACTATED RINGERS IV SOLN
INTRAVENOUS | Status: DC
  Administered 2018-05-07: 18:00:00 via INTRAVENOUS

## 2018-05-07 MED ORDER — ACETAMINOPHEN 325 MG PO TABS: 650.0000 mg | ORAL_TABLET | ORAL | Status: DC | PRN

## 2018-05-07 MED ORDER — OXYTOCIN 40 UNITS IN LACTATED RINGERS INFUSION - SIMPLE MED
2.5000 [IU]/h | INTRAVENOUS | Status: DC
  Filled 2018-05-07: qty 1000

## 2018-05-07 MED ORDER — PHENYLEPHRINE 40 MCG/ML (10ML) SYRINGE FOR IV PUSH (FOR BLOOD PRESSURE SUPPORT)
PREFILLED_SYRINGE | INTRAVENOUS | Status: AC
  Filled 2018-05-07: qty 10

## 2018-05-07 MED ORDER — LACTATED RINGERS IV SOLN
500.0000 mL | Freq: Once | INTRAVENOUS | Status: AC
  Administered 2018-05-07: 500 mL via INTRAVENOUS

## 2018-05-07 MED ORDER — LACTATED RINGERS IV SOLN: 500.0000 mL | Freq: Once | INTRAVENOUS | Status: DC

## 2018-05-07 MED ORDER — DIPHENHYDRAMINE HCL 50 MG/ML IJ SOLN: 12.5000 mg | INTRAMUSCULAR | Status: DC | PRN

## 2018-05-07 MED ORDER — FENTANYL 2.5 MCG/ML BUPIVACAINE 1/10 % EPIDURAL INFUSION (WH - ANES)
14.0000 mL/h | INTRAMUSCULAR | Status: DC | PRN
  Administered 2018-05-07: 14 mL/h via EPIDURAL

## 2018-05-07 MED ORDER — ONDANSETRON HCL 4 MG/2ML IJ SOLN: 4.0000 mg | Freq: Four times a day (QID) | INTRAMUSCULAR | Status: DC | PRN

## 2018-05-07 MED ORDER — LIDOCAINE HCL (PF) 1 % IJ SOLN
INTRAMUSCULAR | Status: DC | PRN
  Administered 2018-05-07 (×2): 4 mL via EPIDURAL

## 2018-05-07 MED ORDER — OXYCODONE-ACETAMINOPHEN 5-325 MG PO TABS: 2.0000 | ORAL_TABLET | ORAL | Status: DC | PRN

## 2018-05-07 NOTE — Anesthesia Preprocedure Evaluation (Signed)
Anesthesia Evaluation  Patient identified by MRN, date of birth, ID band Patient awake    Reviewed: Allergy & Precautions, NPO status , Patient's Chart, lab work & pertinent test results  Airway Mallampati: II  TM Distance: >3 FB Neck ROM: Full    Dental no notable dental hx. (+) Teeth Intact   Pulmonary former smoker,    Pulmonary exam normal breath sounds clear to auscultation       Cardiovascular negative cardio ROS Normal cardiovascular exam Rhythm:Regular Rate:Normal     Neuro/Psych negative neurological ROS  negative psych ROS   GI/Hepatic negative GI ROS, Neg liver ROS,   Endo/Other  Obesity  Renal/GU negative Renal ROS  negative genitourinary   Musculoskeletal negative musculoskeletal ROS (+)   Abdominal (+) + obese,   Peds  Hematology negative hematology ROS (+)   Anesthesia Other Findings   Reproductive/Obstetrics (+) Pregnancy Previous C/Section                             Anesthesia Physical Anesthesia Plan  ASA: II  Anesthesia Plan: Epidural   Post-op Pain Management:    Induction:   PONV Risk Score and Plan:   Airway Management Planned: Natural Airway  Additional Equipment:   Intra-op Plan:   Post-operative Plan:   Informed Consent: I have reviewed the patients History and Physical, chart, labs and discussed the procedure including the risks, benefits and alternatives for the proposed anesthesia with the patient or authorized representative who has indicated his/her understanding and acceptance.     Plan Discussed with: Anesthesiologist  Anesthesia Plan Comments:         Anesthesia Quick Evaluation

## 2018-05-07 NOTE — Progress Notes (Signed)
Patient ID: [name], female   DOB: Aug 01, 1981, 37 y.o.   MRN: [id number] Pt is comfortable with epidural. +Fms VSS EFM - 120, cat 1  TOCO-  ctxs q 1-18mins SVE - 7.5/100/-1  A/P: Progressing in labor      AROM performed with moderate return of clear fluid      Anticipate svd

## 2018-05-07 NOTE — H&P (Signed)
[name] is a 27 [id number] female presenting at 39 0/7 with complaint of vaginal bleeding and contractions. She was 7cm dilated on arrival at MAU. Pt with history of preterm labor at 35 and 36 weeks then had a stat c/s for abruption. She is unsure which route of delivery she wants. She received 17 P this pregnancy. She is GDMA1. 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 GBS negative. She is AMA - panorama low risk female. OB History    Gravida  4   Para  3   Term  1   Preterm  2   AB      Living  3     SAB      TAB      Ectopic      Multiple  0   Live Births  3          Past Medical History:  Diagnosis Date  . Pregnant    Past Surgical History:  Procedure Laterality Date  . CESAREAN SECTION N/A 07/22/2016   Procedure: CESAREAN SECTION;  Surgeon: Christophe Louis, MD;  Location: [place];  Service: Obstetrics;  Laterality: N/A;   Family History: family history is not on file. Social History:  reports that she has quit smoking. She has never used smokeless tobacco. She reports that she does not drink alcohol or use drugs.     Maternal Diabetes: Yes:  Diabetes Type:  Diet controlled Genetic Screening: Normal Maternal Ultrasounds/Referrals: Normal Fetal Ultrasounds or other Referrals:  None Maternal Substance Abuse:  No Significant Maternal Medications:  None Significant Maternal Lab Results:  Lab values include: Group B Strep negative Other Comments:  None  Review of Systems  Constitutional: Negative for chills, fever, malaise/fatigue and weight loss.  Eyes: Negative for blurred vision.  Respiratory: Negative for shortness of breath.   Cardiovascular: Negative for chest pain.  Gastrointestinal: Positive for abdominal pain. Negative for heartburn, nausea and vomiting.  Genitourinary: Negative for dysuria.  Musculoskeletal: Positive for back pain. Negative for myalgias.  Skin: Negative for itching and rash.  Neurological: Negative for  dizziness and headaches.  Endo/Heme/Allergies: Does not bruise/bleed easily.  Psychiatric/Behavioral: Negative for depression, hallucinations, substance abuse and suicidal ideas. The patient is nervous/anxious.    Maternal Medical History:  Reason for admission: Contractions and vaginal bleeding.  Nausea.  Contractions: Onset was 1-2 hours ago.   Frequency: irregular.   Perceived severity is moderate.    Fetal activity: Perceived fetal activity is normal.   Last perceived fetal movement was within the past hour.    Prenatal Complications - Diabetes: gestational.    Dilation: 7 Effacement (%): 31 Station: -3 Exam by:: n druebbisch rn Blood pressure 127/77, pulse 99, temperature 97.8 F (36.6 C), temperature source Oral, resp. rate 16, weight 98 kg, last menstrual period 08/06/2017, SpO2 99 %, unknown if currently breastfeeding. Maternal Exam:  Uterine Assessment: Contraction strength is moderate.  Contraction frequency is rare.   Abdomen: Estimated fetal weight is AGA.   Fetal presentation: vertex  Introitus: Normal vulva. Vulva is negative for condylomata and lesion.  Normal vagina.  Vagina is negative for condylomata.  Pelvis: adequate for delivery.   Cervix: Cervix evaluated by digital exam.     Fetal Exam Fetal Monitor Review: Baseline rate: 135.  Pattern: accelerations present and no decelerations.    Fetal State Assessment: Category I - tracings are normal.     Physical Exam  Constitutional: She is oriented to person, place, and time. She  appears well-developed and well-nourished.  Neck: Normal range of motion.  Cardiovascular: Normal rate.  Respiratory: Effort normal.  GI: There is no tenderness.  Genitourinary: Vagina normal and uterus normal. Vulva exhibits no lesion.  Musculoskeletal: Normal range of motion.  Neurological: She is alert and oriented to person, place, and time.  Skin: Skin is warm.  Psychiatric: She has a normal mood and affect. Her  behavior is normal. Judgment and thought content normal.    Prenatal labs: ABO, Rh: --/--/O POS (02/13 0422) Antibody:   Rubella:   RPR:    HBsAg:    HIV:    GBS:     Assessment/Plan: 37yo [id number] at 22 0/[redacted]wks gestation in active labor - stable - Hx of svd x 2 and c/s x 1 ; discussed r/b of repeat c/s vs TOLAC. All questions answered and pt opts for TOLAC. Will sign consent - Epidural for pain control - AROM after epidural -GBS neg - No active lesions -Anticipate VBAC  Venetia Night Banga 05/07/2018, 6:12 PM

## 2018-05-07 NOTE — Progress Notes (Signed)
Patient ID: [name], female   DOB: [birth year], 37 y.o.   MRN: [id number] Slight drop in baseline noted for approx 40mins but great variability  9/100/-1 O2 applied via mask and pt turned on side  Plan to recheck in 26mins and if able to , start pushing Anticipate svd

## 2018-05-07 NOTE — Anesthesia Procedure Notes (Signed)
Epidural Patient location during procedure: OB Start time: 05/07/2018 7:05 PM End time: 05/07/2018 7:10 PM  Staffing Anesthesiologist: Josephine Igo, MD Performed: anesthesiologist   Preanesthetic Checklist Completed: patient identified, site marked, surgical consent, pre-op evaluation, timeout performed, IV checked, risks and benefits discussed and monitors and equipment checked  Epidural Patient position: sitting Prep: site prepped and draped and DuraPrep Patient monitoring: continuous pulse ox and blood pressure Approach: midline Location: L3-L4 Injection technique: LOR air  Needle:  Needle type: Tuohy  Needle gauge: 17 G Needle length: 9 cm and 9 Needle insertion depth: 5 cm cm Catheter type: closed end flexible Catheter size: 19 Gauge Catheter at skin depth: 10 cm Test dose: negative and Other  Assessment Events: blood not aspirated, injection not painful, no injection resistance, negative IV test and no paresthesia  Additional Notes Patient identified. Risks and benefits discussed including failed block, incomplete  Pain control, post dural puncture headache, nerve damage, paralysis, blood pressure Changes, nausea, vomiting, reactions to medications-both toxic and allergic and post Partum back pain. All questions were answered. Patient expressed understanding and wished to proceed. Sterile technique was used throughout procedure. Epidural site was Dressed with sterile barrier dressing. No paresthesias, signs of intravascular injection Or signs of intrathecal spread were encountered.  Patient was more comfortable after the epidural was dosed. Please see RN's note for documentation of vital signs and FHR which are stable.

## 2018-05-07 NOTE — MAU Note (Signed)
Pt started having bleeding, dark red blood about an hour ago, half pad full. Was 5-6 cm in the office last week. Called and was told to come in to be checked.

## 2018-05-08 LAB — CBC
HEMATOCRIT: 36 % (ref 36.0–46.0)
HEMOGLOBIN: 11.7 g/dL — AB (ref 12.0–15.0)
MCH: 27.6 pg (ref 26.0–34.0)
MCHC: 32.5 g/dL (ref 30.0–36.0)
MCV: 84.9 fL (ref 78.0–100.0)
Platelets: 152 10*3/uL (ref 150–400)
RBC: 4.24 MIL/uL (ref 3.87–5.11)
RDW: 15 % (ref 11.5–15.5)
WBC: 11.5 10*3/uL — ABNORMAL HIGH (ref 4.0–10.5)

## 2018-05-08 MED ORDER — OXYCODONE HCL 5 MG PO TABS: 10.0000 mg | ORAL_TABLET | ORAL | Status: DC | PRN

## 2018-05-08 MED ORDER — PRENATAL MULTIVITAMIN CH
1.0000 | ORAL_TABLET | Freq: Every day | ORAL | Status: DC
  Administered 2018-05-08: 1 via ORAL
  Filled 2018-05-08: qty 1

## 2018-05-08 MED ORDER — DIBUCAINE 1 % RE OINT
1.0000 "application " | TOPICAL_OINTMENT | RECTAL | Status: DC | PRN
  Filled 2018-05-08: qty 28

## 2018-05-08 MED ORDER — ACETAMINOPHEN 325 MG PO TABS
650.0000 mg | ORAL_TABLET | ORAL | Status: DC | PRN
  Administered 2018-05-08 – 2018-05-09 (×6): 650 mg via ORAL
  Filled 2018-05-08 (×6): qty 2

## 2018-05-08 MED ORDER — COCONUT OIL OIL
1.0000 "application " | TOPICAL_OIL | Status: DC | PRN
  Administered 2018-05-08: 1 via TOPICAL
  Filled 2018-05-08 (×2): qty 120

## 2018-05-08 MED ORDER — ZOLPIDEM TARTRATE 5 MG PO TABS: 5.0000 mg | ORAL_TABLET | Freq: Every evening | ORAL | Status: DC | PRN

## 2018-05-08 MED ORDER — TETANUS-DIPHTH-ACELL PERTUSSIS 5-2.5-18.5 LF-MCG/0.5 IM SUSP
0.5000 mL | Freq: Once | INTRAMUSCULAR | Status: DC
  Filled 2018-05-08: qty 0.5

## 2018-05-08 MED ORDER — DIPHENHYDRAMINE HCL 25 MG PO CAPS: 25.0000 mg | ORAL_CAPSULE | Freq: Four times a day (QID) | ORAL | Status: DC | PRN

## 2018-05-08 MED ORDER — ONDANSETRON HCL 4 MG/2ML IJ SOLN: 4.0000 mg | INTRAMUSCULAR | Status: DC | PRN

## 2018-05-08 MED ORDER — ONDANSETRON HCL 4 MG PO TABS: 4.0000 mg | ORAL_TABLET | ORAL | Status: DC | PRN

## 2018-05-08 MED ORDER — BENZOCAINE-MENTHOL 20-0.5 % EX AERO
1.0000 "application " | INHALATION_SPRAY | CUTANEOUS | Status: DC | PRN
  Filled 2018-05-08 (×3): qty 56

## 2018-05-08 MED ORDER — WITCH HAZEL-GLYCERIN EX PADS: 1.0000 "application " | MEDICATED_PAD | CUTANEOUS | Status: DC | PRN

## 2018-05-08 MED ORDER — SENNOSIDES-DOCUSATE SODIUM 8.6-50 MG PO TABS
2.0000 | ORAL_TABLET | ORAL | Status: DC
  Administered 2018-05-08 (×2): 2 via ORAL
  Filled 2018-05-08 (×2): qty 2

## 2018-05-08 MED ORDER — OXYCODONE HCL 5 MG PO TABS
5.0000 mg | ORAL_TABLET | ORAL | Status: DC | PRN
  Administered 2018-05-08 (×3): 5 mg via ORAL
  Filled 2018-05-08 (×3): qty 1

## 2018-05-08 MED ORDER — SIMETHICONE 80 MG PO CHEW: 80.0000 mg | CHEWABLE_TABLET | ORAL | Status: DC | PRN

## 2018-05-08 NOTE — Lactation Note (Signed)
This note was copied from a baby's chart. Lactation Consultation Note  Patient Name: Girl Lajean Boese JZPHX'T Date: 05/08/2018 Reason for consult: Initial assessment;Early term 37-38.6wks;Nipple pain/trauma  Visited with P4 Mom of ET baby at 39 hrs old.  Baby sleeping in crib, and Mom sleeping on her side.  Mom's eyes opened up when Grabill peeked in room.   Left Lactation brochure with Mom.  Talked briefly about ET newborn behavior, and to watch for sleepiness when breastfeeding.  Mom's last 2 babies were 35 and 36 weeks, and she started out pumping.    Encouraged STS and cue based feedings, goal of 8-12 feedings per 24 hrs.   Some pain felt on latch, nipples look intact, but Mom worried that baby isn't latching deep enough.   Baby has breastfed 5 times already.  Encouraged Mom to call for latch assist/assessment at next feeding.  Mom closed her eyes.   Consult Status Consult Status: Follow-up Date: 05/09/18 Follow-up type: In-patient    Broadus John 05/08/2018, 2:18 PM

## 2018-05-08 NOTE — Anesthesia Postprocedure Evaluation (Signed)
Anesthesia Post Note  Patient: [name]  Procedure(s) Performed: AN AD HOC LABOR EPIDURAL     Patient location during evaluation: Mother Baby Anesthesia Type: Epidural Level of consciousness: awake and alert Pain management: pain level controlled Vital Signs Assessment: post-procedure vital signs reviewed and stable Respiratory status: spontaneous breathing, nonlabored ventilation and respiratory function stable Cardiovascular status: stable Postop Assessment: no headache, no backache, epidural receding, no apparent nausea or vomiting, patient able to bend at knees, adequate PO intake and able to ambulate Anesthetic complications: no    Last Vitals:  Vitals:   05/08/18 0130 05/08/18 0513  BP: 125/69 126/79  Pulse: 89 88  Resp: 18 18  Temp: 37.1 C 36.7 C  SpO2: 97% 98%    Last Pain:  Vitals:   05/08/18 0513  TempSrc:   PainSc: 5    Pain Goal: Patients Stated Pain Goal: 3 (05/07/18 1828)               Jabier Mutton

## 2018-05-08 NOTE — Discharge Summary (Signed)
OB Discharge Summary     Patient Name: [name] DOB: 1981-04-30 MRN: [id number]  Date of admission: 05/07/2018 Delivering MD: Carlynn Purl [facility]   Date of discharge: 05/09/2018  Admitting diagnosis: 73 WKS, BLEEDING Intrauterine pregnancy: [redacted]w[redacted]d     Secondary diagnosis:  Active Problems:   Term pregnancy   VBAC (vaginal birth after Cesarean)   Postpartum care following vaginal delivery  Additional problems: none     Discharge diagnosis: Term Pregnancy Delivered                                                                                                Post partum procedures:none  Augmentation: AROM  Complications: None  Hospital course:  Onset of Labor With Vaginal Delivery     36 y.o. yo [id number] at [redacted]w[redacted]d was admitted in Active Labor on 05/07/2018. Patient had an uncomplicated labor course as follows:  Membrane Rupture Time/Date: 8:01 PM ,05/07/2018   Intrapartum Procedures: Episiotomy: None [1]                                         Lacerations:     Patient had a delivery of a Viable infant. 05/07/2018  Information for the patient's newborn:  Carrie, Mcneil Girl [name] [[id number]]  Delivery Method: Vaginal, Spontaneous(Filed from Delivery Summary)    Pateint had an uncomplicated postpartum course.  She is ambulating, tolerating a regular diet, passing flatus, and urinating well. Patient is discharged home in stable condition on 05/09/18.   Physical exam  Vitals:   05/08/18 1020 05/08/18 1330 05/08/18 2242 05/09/18 0621  BP: 105/67 117/72 118/82 124/84  Pulse: 87 76 87 80  Resp: 16 16 18 18   Temp: 97.8 F (36.6 C) 98 F (36.7 C) 98.1 F (36.7 C) 98.3 F (36.8 C)  TempSrc: Axillary Oral Oral Oral  SpO2: 100% 95% 99%   Weight:      Height:       General: alert and cooperative Lochia: appropriate Uterine Fundus: firm  Labs: Lab Results  Component Value Date   WBC 11.5 (H) 05/08/2018   HGB 11.7 (L) 05/08/2018   HCT 36.0 05/08/2018   MCV 84.9 05/08/2018    PLT 152 05/08/2018   CMP Latest Ref Rng & Units 10/05/2017  Glucose 65 - 99 mg/dL 94  BUN 6 - 20 mg/dL 7  Creatinine 0.44 - 1.00 mg/dL 0.66  Sodium 135 - 145 mmol/L 134(L)  Potassium 3.5 - 5.1 mmol/L 3.7  Chloride 101 - 111 mmol/L 102  CO2 22 - 32 mmol/L 21(L)  Calcium 8.9 - 10.3 mg/dL 8.8(L)  Total Protein 6.0 - 8.3 g/dL -  Total Bilirubin 0.2 - 1.2 mg/dL -  Alkaline Phos 39 - 117 U/L -  AST 0 - 37 U/L -  ALT 0 - 35 U/L -    Discharge instruction: per After Visit Summary and "Baby and Me Booklet".  After visit meds:  Allergies as of 05/09/2018   No Known Allergies     Medication List  STOP taking these medications   valACYclovir 1000 MG tablet Commonly known as:  VALTREX     TAKE these medications   acetaminophen 325 MG tablet Commonly known as:  TYLENOL Take 2 tablets (650 mg total) by mouth every 4 (four) hours as needed (for pain scale < 4).   ibuprofen 600 MG tablet Commonly known as:  ADVIL,MOTRIN Take 1 tablet (600 mg total) by mouth every 6 (six) hours.   oxyCODONE 5 MG immediate release tablet Commonly known as:  Oxy IR/ROXICODONE Take 1 tablet (5 mg total) by mouth every 4 (four) hours as needed (pain scale 4-7).   prenatal multivitamin Tabs tablet Take 1 tablet by mouth daily at 12 noon.       Diet: routine diet  Activity: Advance as tolerated. Pelvic rest for 6 weeks.   Outpatient follow up:6 weeks Follow up Appt:No future appointments. Follow up Visit:No follow-ups on file.  Postpartum contraception: Undecided  Newborn Data: Live born female  Birth Weight: 7 lb 10.6 oz (3476 g) APGAR: 56, 9  Newborn Delivery   Birth date/time:  05/07/2018 21:56:00 Delivery type:  Vaginal, Spontaneous     Baby Feeding: Breast Disposition:home with mother   05/09/2018 Carrie Bores, MD

## 2018-05-08 NOTE — Progress Notes (Signed)
PPD #1 VBAC No problems Afeb, VSS Fundus firm, NT at U-1 Continue routine postpartum care

## 2018-05-08 NOTE — Lactation Note (Signed)
This note was copied from a baby's chart. Lactation Consultation Note  Patient Name: [name] [id number] Date: 05/08/2018 Reason for consult: Follow-up assessment;Nipple pain/trauma  Mom called out for latch assist.  RN assisted but reported baby latched briefly and coming off the breast and nipple pinched.   Baby noted to be dressed and swaddled in football hold.  One pillow under baby, and Mom hunched over to the side in order to bring breast to baby. Offered to assist.  Unwrapped baby and undressed baby down to a diaper.  Baby started cueing vigorously.  Sat Mom up more, adding pillows for support.  Teaching done while setting up baby in football hold.  Breast massage and hand expression reviewed, drops of colostrum noted. Positioned baby and showed Mom how to stimulate for a wide gape, and then bring baby to her when he is opening widely.  After a couple attempts, baby was able to attain a deeper latch, without any discomfort felt.  Taught Mom how to use alternate breast compression, swallows identified for Mom.   Encouraged Mom to keep baby STS, and watch for feeding cues.  Goal of >8 feedings per 24 hrs. Lactation brochure given.  Mom aware of IP and OP lactation support available.    Maternal Data Formula Feeding for Exclusion: Yes Reason for exclusion: Mother's choice to formula and breast feed on admission Has patient been taught Hand Expression?: Yes Does the patient have breastfeeding experience prior to this delivery?: Yes  Feeding Feeding Type: Breast Fed Length of feed: 30 min  LATCH Score Latch: Grasps breast easily, tongue down, lips flanged, rhythmical sucking.  Audible Swallowing: Spontaneous and intermittent  Type of Nipple: Everted at rest and after stimulation  Comfort (Breast/Nipple): Filling, red/small blisters or bruises, mild/mod discomfort  Hold (Positioning): Assistance needed to correctly position infant at breast and maintain latch.  LATCH Score:  8  Interventions Interventions: Breast feeding basics reviewed;Assisted with latch;Skin to skin;Breast massage;Hand express;Breast compression;Adjust position;Support pillows;Position options;Coconut oil  Consult Status Consult Status: Follow-up Date: 05/09/18 Follow-up type: [place] 05/08/2018, 3:04 PM

## 2018-05-09 LAB — RPR: RPR Ser Ql: NONREACTIVE

## 2018-05-09 MED ORDER — ACETAMINOPHEN 325 MG PO TABS: 650.0000 mg | ORAL_TABLET | ORAL | 0 refills | Status: DC | PRN

## 2018-05-09 MED ORDER — OXYCODONE HCL 5 MG PO TABS: 5.0000 mg | ORAL_TABLET | ORAL | 0 refills | Status: DC | PRN

## 2018-05-09 MED ORDER — IBUPROFEN 600 MG PO TABS: 600.0000 mg | ORAL_TABLET | Freq: Four times a day (QID) | ORAL | 0 refills | Status: DC

## 2018-05-09 NOTE — Progress Notes (Signed)
Post Partum Day 2 Subjective: no complaints, up ad lib and tolerating PO.  Taking occasional oxycontin for cramps  Objective: Blood pressure 124/84, pulse 80, temperature 98.3 F (36.8 C), temperature source Oral, resp. rate 18, height 5\' 6"  (1.676 m), weight 98 kg, last menstrual period 08/06/2017, SpO2 99 %, unknown if currently breastfeeding.  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  She has zoloft at home and will sstart it and let us know if starts to have issues   LOS: 2 days   Logan Bores 05/09/2018, 8:41 AM

## 2018-05-09 NOTE — Lactation Note (Signed)
This note was copied from a baby's chart. Lactation Consultation Note  Patient Name: [name] [id number] Date: 05/09/2018 Reason for consult: Follow-up assessment  Visited with P4 Mom of ET infant on day of discharge, baby 64 hrs old and at 5% weight loss. Mom chose to give baby some formula by bottle last night, but has resumed breastfeeding.  Baby latching well without any discomfort felt. Encouraged STS and feeding baby often on cue.   Engorgement prevention and treatment reviewed. Mom aware of OP lactation support and encouraged Mom to call prn.  Mom denies any questions.   Consult Status Consult Status: Complete Date: 05/09/18 Follow-up type: Call as needed    Broadus John 05/09/2018, 10:43 AM

## 2018-05-26 ENCOUNTER — Inpatient Hospital Stay (HOSPITAL_COMMUNITY): Admit: 2018-05-26 | Payer: BLUE CROSS/BLUE SHIELD | Admitting: Obstetrics and Gynecology

## 2018-05-26 ENCOUNTER — Encounter (HOSPITAL_COMMUNITY): Payer: Self-pay

## 2018-05-26 SURGERY — Surgical Case
Anesthesia: Spinal

## 2018-11-03 ENCOUNTER — Other Ambulatory Visit: Payer: Self-pay

## 2018-11-03 ENCOUNTER — Ambulatory Visit (HOSPITAL_COMMUNITY)
Admission: EM | Admit: 2018-11-03 | Discharge: 2018-11-03 | Disposition: A | Discharge status: Home / Self Care | Attending: Family Medicine | Admitting: Family Medicine

## 2018-11-03 ENCOUNTER — Encounter (HOSPITAL_COMMUNITY): Payer: Self-pay | Admitting: Family Medicine

## 2018-11-03 DIAGNOSIS — R6889 Other general symptoms and signs: Secondary | ICD-10-CM | POA: Diagnosis present

## 2018-11-03 LAB — POCT RAPID STREP A: Streptococcus, Group A Screen (Direct): NEGATIVE

## 2018-11-03 MED ORDER — OSELTAMIVIR PHOSPHATE 75 MG PO CAPS: 75.0000 mg | ORAL_CAPSULE | Freq: Two times a day (BID) | ORAL | 0 refills | Status: DC

## 2018-11-03 NOTE — Discharge Instructions (Addendum)
The strep test is negative.  Your symptoms are very consistent with a viral illness such as flu.  Because you have had the flu shot he may not run a fever.  I think the prudent thing to do now is to start you on Tamiflu and expect this illness to largely resolve over the weekend.  I think it safe to go back to work on Monday.  He had none of the risk factors for Covid 19

## 2018-11-03 NOTE — ED Triage Notes (Signed)
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 With also nasal drainage.

## 2018-11-03 NOTE — ED Provider Notes (Signed)
[facility]    CSN: [id number] Arrival date & time: 11/03/18  [place]     History   Chief Complaint Chief Complaint  Patient presents with  . Cough  . Nasal Congestion    HPI [name] is a 38 y.o. female.   This is an established [facility] urgent care patient who presents with cough and nasal congestion today.  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  No fever  Rec'd flu shot     Past Medical History:  Diagnosis Date  . Pregnant     Patient Active Problem List   Diagnosis Date Noted  . Postpartum care following vaginal delivery 05/08/2018  . Term pregnancy 05/07/2018  . VBAC (vaginal birth after Cesarean) 05/07/2018  . Abnormal glucose tolerance test (GTT) during pregnancy, antepartum 03/13/2018  . Routine physical examination 12/14/2016    Past Surgical History:  Procedure Laterality Date  . CESAREAN SECTION N/A 07/22/2016   Procedure: CESAREAN SECTION;  Surgeon: Christophe Louis, MD;  Location: [facility];  Service: Obstetrics;  Laterality: N/A;    OB History    Gravida  4   Para  4   Term  2   Preterm  2   AB      Living  4     SAB      TAB      Ectopic      Multiple  0   Live Births  4            Home Medications    Prior to Admission medications   Medication Sig Start Date End Date Taking? Authorizing Provider  oseltamivir (TAMIFLU) 75 MG capsule Take 1 capsule (75 mg total) by mouth every 12 (twelve) hours. 11/03/18   Robyn Haber, MD  Prenatal Vit-Fe Fumarate-FA (PRENATAL MULTIVITAMIN) TABS tablet Take 1 tablet by mouth daily at 12 noon.    [provider]    Family History Family History  Problem Relation Age of Onset  . Colon cancer Neg Hx   . Breast cancer Neg Hx   . Ovarian cancer Neg Hx     Social History Social History   Tobacco Use  . Smoking status: Former Research scientist (life sciences)  . Smokeless tobacco:  Never Used  Substance Use Topics  . Alcohol use: No  . Drug use: No     Allergies   Patient has no known allergies.   Review of Systems Review of Systems   Physical Exam Triage Vital Signs ED Triage Vitals [11/03/18 1908]  Enc Vitals Group     BP (!) 153/73     Pulse Rate 86     Resp 16     Temp 98.3 F (36.8 C)     Temp Source Oral     SpO2 100 %     Weight      Height      Head Circumference      Peak Flow      Pain Score      Pain Loc      Pain Edu?      Excl. in [place]?    No data found.  Updated Vital Signs BP (!) 153/73 (BP Location: Right Arm)   Pulse 86   Temp 98.3 F (36.8 C) (Oral)   Resp 16   Ht 5\' 5"  (1.651 m)   Wt 77.1 kg   SpO2 100%   BMI 28.29  kg/m    Physical Exam Vitals signs and nursing note reviewed.  Constitutional:      Appearance: Normal appearance. She is normal weight.  HENT:     Head: Normocephalic.     Right Ear: Tympanic membrane and external ear normal.     Left Ear: Tympanic membrane and external ear normal.     Nose: Congestion present.     Mouth/Throat:     Mouth: Mucous membranes are moist.     Pharynx: Posterior oropharyngeal erythema present.  Eyes:     Conjunctiva/sclera: Conjunctivae normal.     Pupils: Pupils are equal, round, and reactive to light.  Neck:     Musculoskeletal: Normal range of motion and neck supple.  Cardiovascular:     Rate and Rhythm: Normal rate and regular rhythm.     Heart sounds: Normal heart sounds.  Pulmonary:     Effort: Pulmonary effort is normal.     Breath sounds: Normal breath sounds.  Musculoskeletal: Normal range of motion.  Lymphadenopathy:     Cervical: No cervical adenopathy.  Skin:    General: Skin is warm and dry.  Neurological:     General: No focal deficit present.     Mental Status: She is alert and oriented to person, place, and time.  Psychiatric:        Mood and Affect: Mood normal.      UC Treatments / Results  Labs (all labs ordered are listed, but  only abnormal results are displayed) Labs Reviewed  CULTURE, GROUP A STREP [facility])  POCT RAPID STREP A    EKG None  Radiology No results found.  Procedures Procedures (including critical care time)  Medications Ordered in UC Medications - No data to display  Initial Impression / Assessment and Plan / UC Course  I have reviewed the triage vital signs and the nursing notes.  Pertinent labs & imaging results that were available during my care of the patient were reviewed by me and considered in my medical decision making (see chart for details).    Final Clinical Impressions(s) / UC Diagnoses   Final diagnoses:  Flu-like symptoms     Discharge Instructions     The strep test is negative.  Your symptoms are very consistent with a viral illness such as flu.  Because you have had the flu shot he may not run a fever.  I think the prudent thing to do now is to start you on Tamiflu and expect this illness to largely resolve over the weekend.  I think it safe to go back to work on Monday.  He had none of the risk factors for Covid 19    ED Prescriptions    Medication Sig Dispense Auth. Provider   oseltamivir (TAMIFLU) 75 MG capsule Take 1 capsule (75 mg total) by mouth every 12 (twelve) hours. 10 capsule Robyn Haber, MD     Controlled Substance Prescriptions [place] Controlled Substance Registry consulted? Not Applicable   Robyn Haber, MD 11/03/18 [phone]

## 2018-11-06 LAB — CULTURE, GROUP A STREP (THRC)

## 2019-01-19 IMAGING — US US OB TRANSVAGINAL
1 series · 15 of 28 positions shown · non-contrast
Comparison: None.

CLINICAL DATA: Pregnant patient in first-trimester pregnancy with
vaginal bleeding.

EXAM:
OBSTETRIC <14 WK US AND TRANSVAGINAL OB US
TECHNIQUE: Both transabdominal and transvaginal ultrasound examinations were
performed for complete evaluation of the gestation as well as the
maternal uterus, adnexal regions, and pelvic cul-de-sac.
Transvaginal technique was performed to assess early pregnancy.

[Series 1: us ob transvaginal · 15 of 64 slices shown]
[im 1/64]
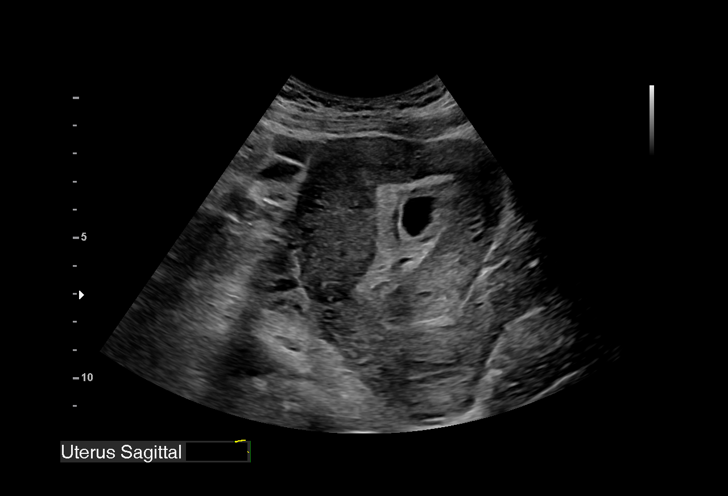
[im 5/64]
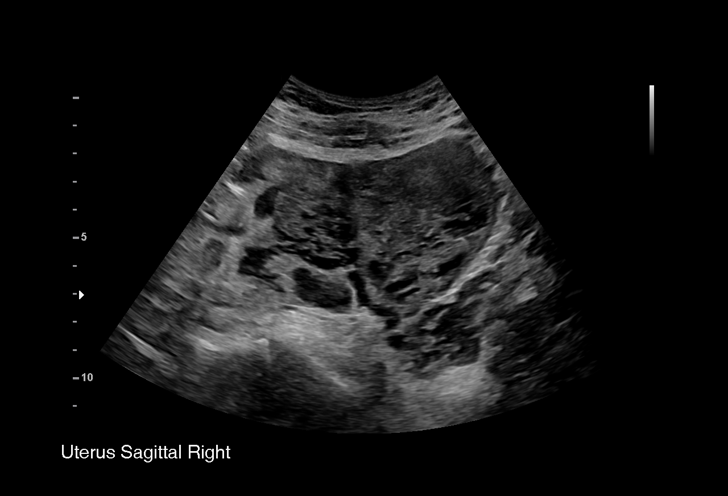
[im 10/64]
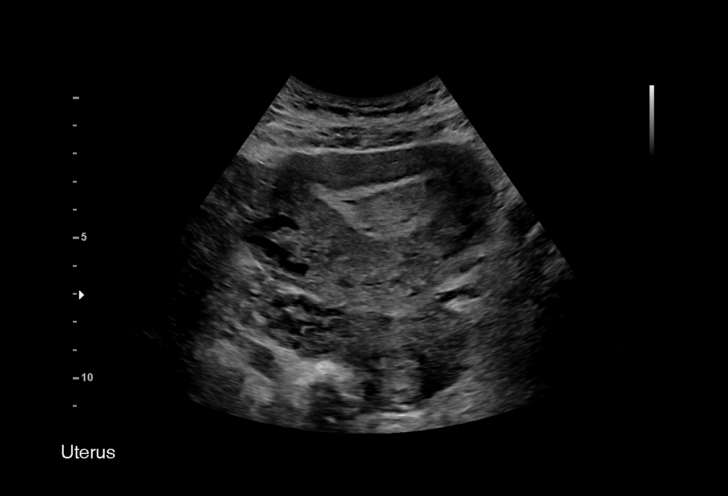
[im 15/64]
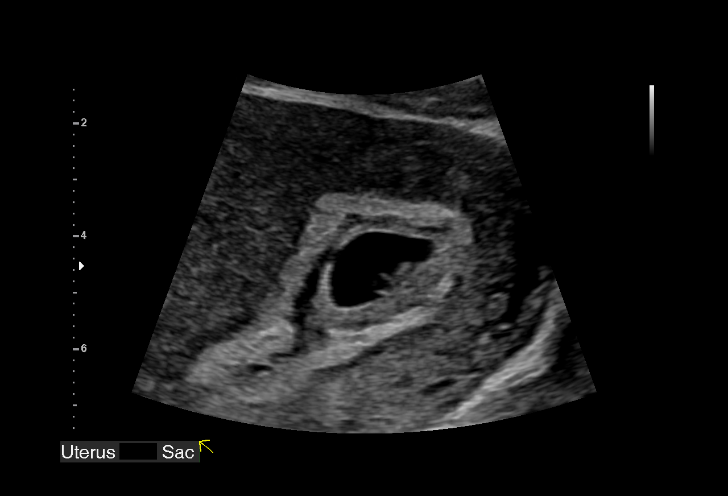
[im 19/64]
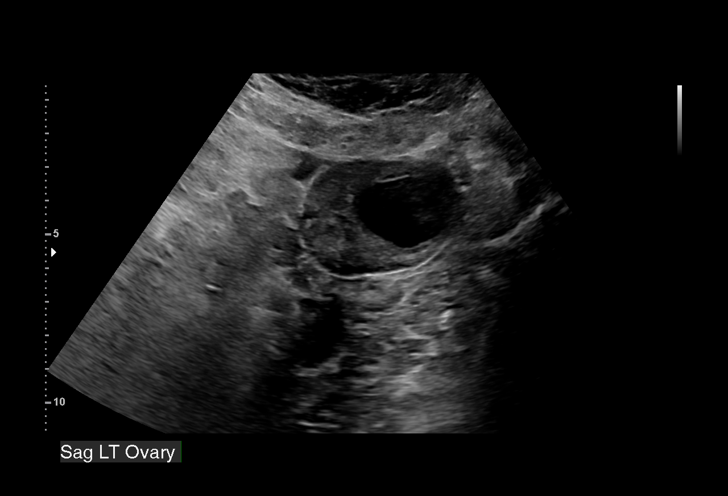
[im 24/64]
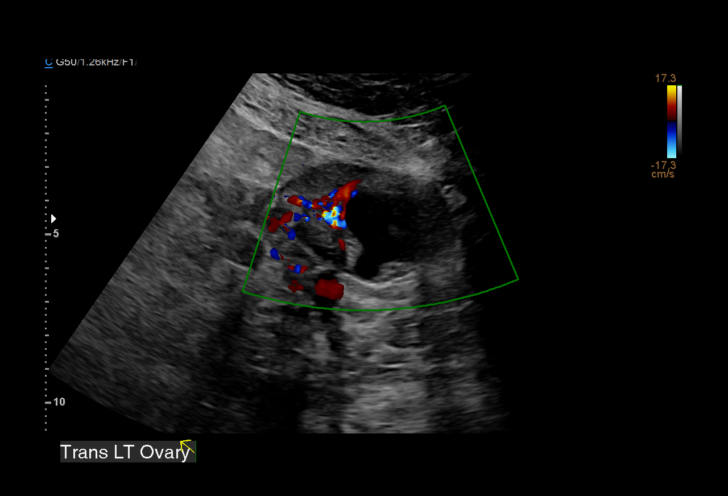
[im 29/64]
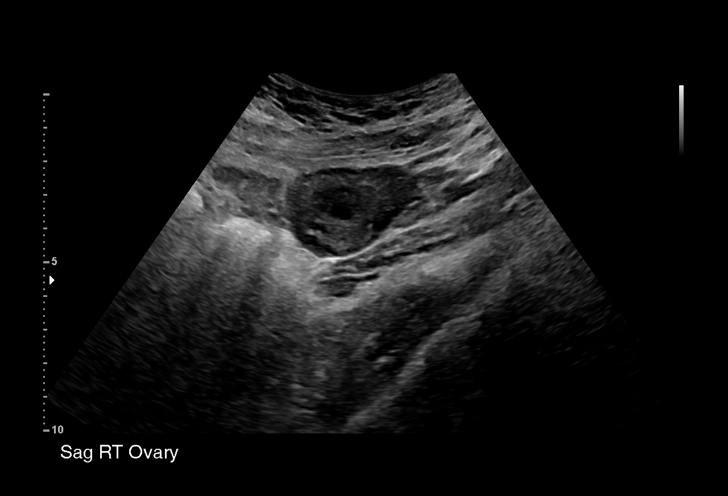
[im 33/64]
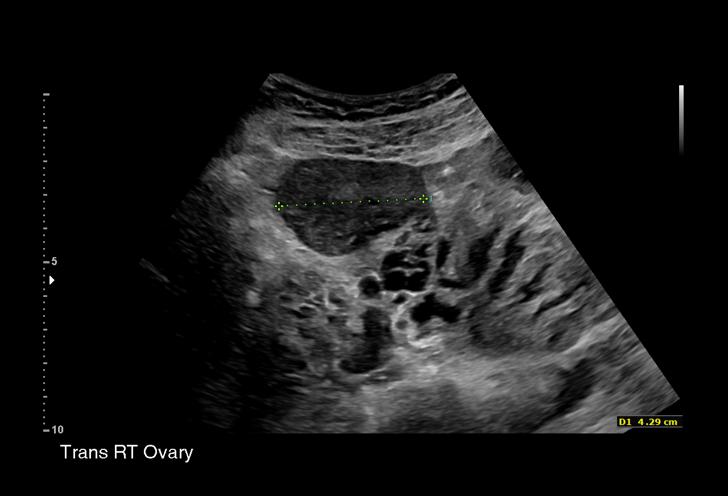
[im 36/64]
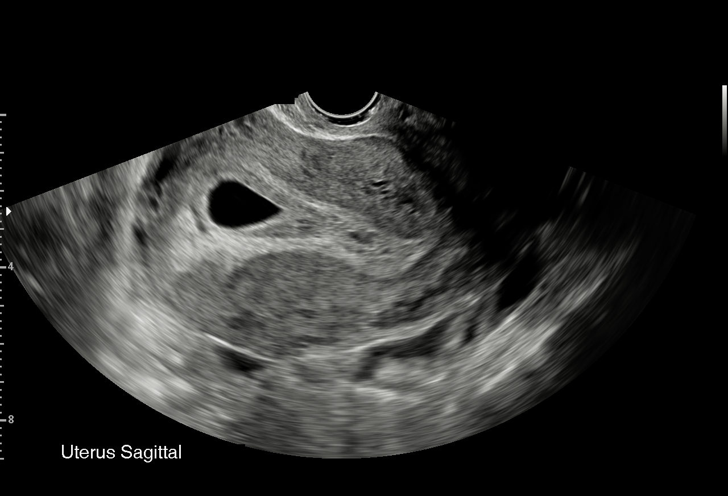
[im 40/64]
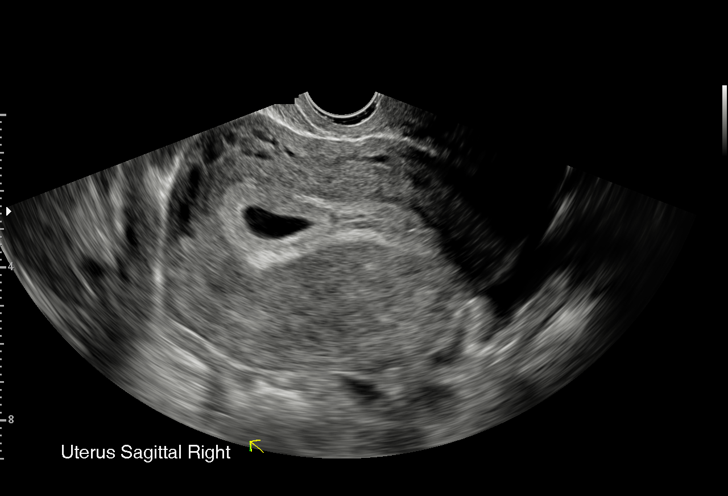
[im 45/64]
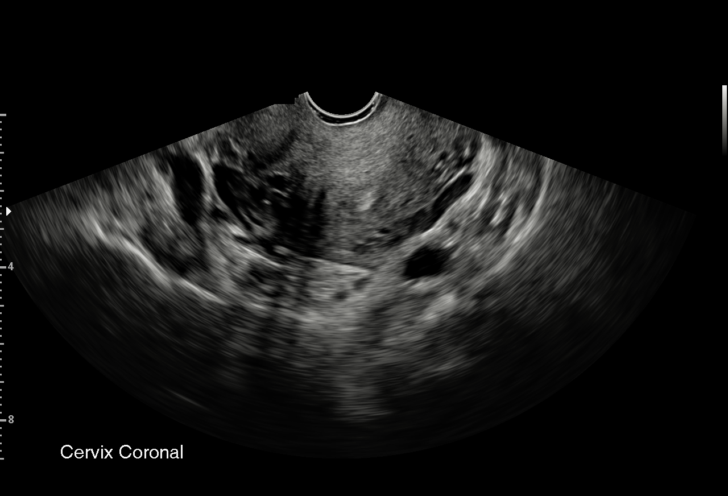
[im 50/64]
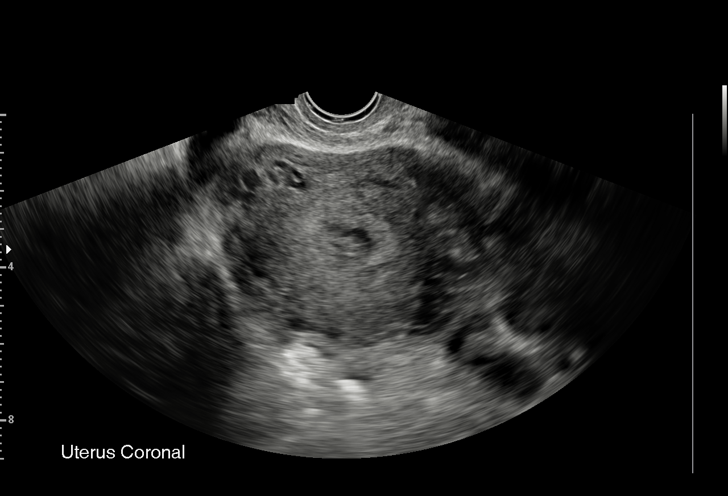
[im 54/64]
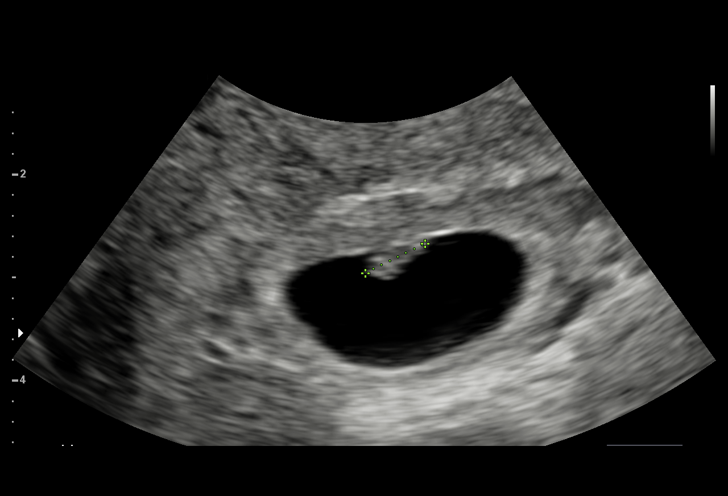
[im 59/64]
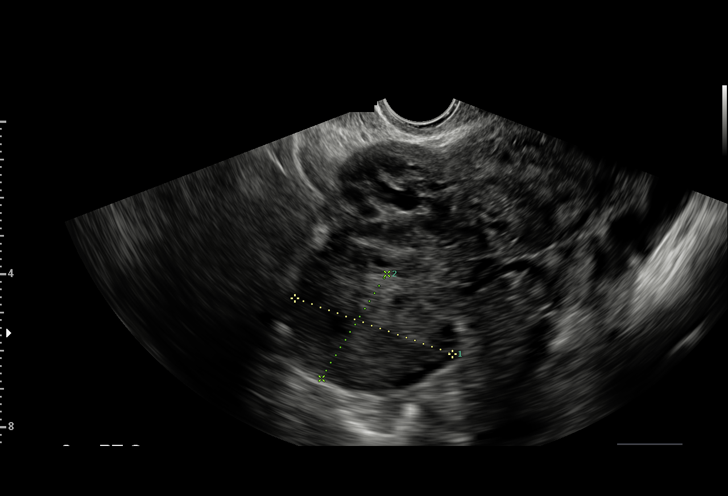
[im 64/64]
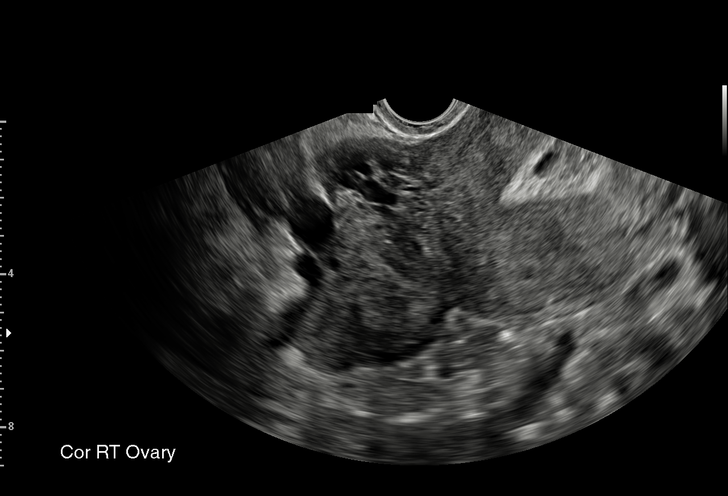

[15 of 28 positions shown; findings below may reference images not displayed]

FINDINGS: Intrauterine gestational sac: Single

Yolk sac:  Visualized.

Embryo:  Visualized.

Cardiac Activity: Visualized.

Heart Rate: 113 bpm

CRL:  5.7 mm   6 w   2 d                  US EDC: 05/29/2018

Subchorionic hemorrhage:  None visualized.

Maternal uterus/adnexae: Left ovarian cyst measures 2.7 cm. Blood
flow seen to the ovarian parenchyma. The right ovary appears normal.
No adnexal mass. No pelvic free fluid.
IMPRESSION: Single live intrauterine pregnancy estimated gestational age 6 weeks
2 days based on crown-rump length for estimated date of delivery
05/29/2018. No subchorionic hemorrhage.

## 2019-03-15 ENCOUNTER — Encounter: Payer: Self-pay | Admitting: Nurse Practitioner

## 2019-03-15 ENCOUNTER — Other Ambulatory Visit: Payer: Self-pay

## 2019-03-15 ENCOUNTER — Ambulatory Visit (INDEPENDENT_AMBULATORY_CARE_PROVIDER_SITE_OTHER): Admitting: Nurse Practitioner

## 2019-03-15 VITALS — BP 115/85 | HR 83 | Temp 98.7°F | Resp 16 | Ht 66.0 in | Wt 187.2 lb

## 2019-03-15 DIAGNOSIS — Z862 Personal history of diseases of the blood and blood-forming organs and certain disorders involving the immune mechanism: Secondary | ICD-10-CM | POA: Diagnosis not present

## 2019-03-15 DIAGNOSIS — Z7689 Persons encountering health services in other specified circumstances: Secondary | ICD-10-CM | POA: Diagnosis not present

## 2019-03-15 DIAGNOSIS — E669 Obesity, unspecified: Secondary | ICD-10-CM

## 2019-03-15 LAB — CBC WITH DIFFERENTIAL/PLATELET
Absolute Monocytes: 288 cells/uL (ref 200–950)
Basophils Absolute: 41 cells/uL (ref 0–200)
Basophils Relative: 0.9 %
Eosinophils Absolute: 50 cells/uL (ref 15–500)
Eosinophils Relative: 1.1 %
HCT: 39.8 % (ref 35.0–45.0)
Hemoglobin: 13.2 g/dL (ref 11.7–15.5)
Lymphs Abs: 1971 cells/uL (ref 850–3900)
MCH: 27 pg (ref 27.0–33.0)
MCHC: 33.2 g/dL (ref 32.0–36.0)
MCV: 81.4 fL (ref 80.0–100.0)
MPV: 11.8 fL (ref 7.5–12.5)
Monocytes Relative: 6.4 %
Neutro Abs: 2151 cells/uL (ref 1500–7800)
Neutrophils Relative %: 47.8 %
Platelets: 256 10*3/uL (ref 140–400)
RBC: 4.89 10*6/uL (ref 3.80–5.10)
RDW: 12.9 % (ref 11.0–15.0)
Total Lymphocyte: 43.8 %
WBC: 4.5 10*3/uL (ref 3.8–10.8)

## 2019-03-15 NOTE — Patient Instructions (Addendum)
[name],   Thank you for coming in to clinic today.  1. We will get presurgical and physical screening labs at your physical.  2. Continue your great work toward weight loss! - Use your low glycemic diet if you are choosing to eat carbs.  Please schedule a follow-up appointment with Cassell Smiles, AGNP. Return in about 6 weeks (around 04/26/2019) for annual physical plus presurgical EKG, xray.  If you have any other questions or concerns, please feel free to call the clinic or send a message through [place]. You may also schedule an earlier appointment if necessary.  You will receive a survey after today's visit either digitally by e-mail or paper by [employer]. Your experiences and feedback matter to [place].  Please respond so we know how we are doing as we provide care for you.   Cassell Smiles, DNP, AGNP-BC Adult Gerontology Nurse Practitioner [place]

## 2019-03-15 NOTE — Progress Notes (Signed)
Subjective:    Patient ID: [name], female    DOB: 29-Jul-1981, 38 y.o.   MRN: [id number]  [name] is a 38 y.o. female presenting on 03/15/2019 for [facility] Provider Pt last seen by PCP many years ago.  Obtain records from OBGYN for recent preventative care.    BMI Started about 1 month ago watching diet, constantly moving with work/home but has no regular exercise.  Started losing weight at 192 lbs,  Now 187.   - Removing high carbs except fruits and veggies, decreased fat, increasing fluid.   History of anemia: - Patient is taking ferrous sulfate.  Patient's concern is having anemia prior to cosmetic surgery scheduled in October.  Has not had recheck of hemoglobin since her last pregnancy, but has had anemia through her last pregnancy  Surgery in October: Breast aug, tummy tuck 1st week of oct.  Patient requests future physical and pre-surgical screening.  Past Medical History:  Diagnosis Date  . Pregnant    Past Surgical History:  Procedure Laterality Date  . CESAREAN SECTION N/A 07/22/2016   Procedure: CESAREAN SECTION;  Surgeon: Christophe Louis, MD;  [facility];  Service: Obstetrics;  Laterality: N/A;   Social History   Socioeconomic History  . Marital status: Married    Spouse name: Not on file  . Number of children: 3  . Years of education: Not on file  . Highest education level: Not on file  Occupational History  . Not on file  Social Needs  . Financial resource strain: Not on file  . Food insecurity    Worry: Not on file    Inability: Not on file  . Transportation needs    Medical: Not on file    Non-medical: Not on file  Tobacco Use  . Smoking status: Former Smoker    Packs/day: 0.50    Years: 3.00    Pack years: 1.50    Types: Cigarettes    Start date: 03/14/2013    Quit date: 03/14/2016    Years since quitting: 3.0  . Smokeless tobacco: Never Used  Substance and Sexual Activity  . Alcohol use: No   . Drug use: No  . Sexual activity: Yes  Lifestyle  . Physical activity    Days per week: Not on file    Minutes per session: Not on file  . Stress: Not on file  Relationships  . Social Herbalist on phone: Not on file    Gets together: Not on file    Attends religious service: Not on file    Active member of club or organization: Not on file    Attends meetings of clubs or organizations: Not on file    Relationship status: Not on file  . Intimate partner violence    Fear of current or ex partner: No    Emotionally abused: No    Physically abused: No    Forced sexual activity: No  Other Topics Concern  . 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 Cancer Father        lung cancer  . Colon cancer Neg Hx   . Breast cancer Neg Hx   . Ovarian cancer Neg Hx  Current Outpatient Medications on File Prior to Visit  Medication Sig  . Prenatal Vit-Fe Fumarate-FA (PRENATAL MULTIVITAMIN) TABS tablet Take 1 tablet by mouth daily at 12 noon.  . ferrous sulfate 325 (65 FE) MG tablet Take by mouth.  . oseltamivir (TAMIFLU) 75 MG capsule Take 1 capsule (75 mg total) by mouth every 12 (twelve) hours.   No current facility-administered medications on file prior to visit.     Review of Systems  Constitutional: Negative.   HENT: Negative.   Eyes: Negative.   Respiratory: Negative.   Cardiovascular: Negative.   Gastrointestinal: Negative.   Endocrine: Negative.   Genitourinary: Negative.   Musculoskeletal: Negative.   Skin: Negative.   Allergic/Immunologic: Negative.   Neurological: Negative.   Hematological: Negative.   Psychiatric/Behavioral: Negative.    Per HPI unless specifically indicated above     Objective:    BP 115/85   Pulse 83   Temp 98.7 F (37.1 C) (Oral)   Resp 16   Ht 5\' 6"  (1.676  m)   Wt 187 lb 3.2 oz (84.9 kg)   BMI 30.21 kg/m   Wt Readings from Last 3 Encounters:  03/15/19 187 lb 3.2 oz (84.9 kg)  11/03/18 170 lb (77.1 kg)  05/07/18 216 lb (98 kg)    Physical Exam Vitals signs reviewed.  Constitutional:      General: She is not in acute distress.    Appearance: She is well-developed.  HENT:     Head: Normocephalic and atraumatic.  Cardiovascular:     Rate and Rhythm: Normal rate and regular rhythm.     Pulses:          Radial pulses are 2+ on the right side and 2+ on the left side.       Posterior tibial pulses are 2+ on the right side and 2+ on the left side.     Heart sounds: Normal heart sounds, S1 normal and S2 normal.  Pulmonary:     Effort: Pulmonary effort is normal. No respiratory distress.     Breath sounds: Normal breath sounds and air entry.  Musculoskeletal:     Right lower leg: No edema.     Left lower leg: No edema.  Skin:    General: Skin is warm and dry.     Capillary Refill: Capillary refill takes less than 2 seconds.  Neurological:     Mental Status: She is alert and oriented to person, place, and time.  Psychiatric:        Attention and Perception: Attention normal.        Mood and Affect: Mood and affect normal.        Behavior: Behavior normal. Behavior is cooperative.    Results for orders placed or performed in visit on 03/15/19  CBC with Differential/Platelet  Result Value Ref Range   WBC 4.5 3.8 - 10.8 Thousand/uL   RBC 4.89 3.80 - 5.10 Million/uL   Hemoglobin 13.2 11.7 - 15.5 g/dL   HCT 39.8 35.0 - 45.0 %   MCV 81.4 80.0 - 100.0 fL   MCH 27.0 27.0 - 33.0 pg   MCHC 33.2 32.0 - 36.0 g/dL   RDW 12.9 11.0 - 15.0 %   Platelets 256 140 - 400 Thousand/uL   MPV 11.8 7.5 - 12.5 fL   Neutro Abs 2,151 1,500 - 7,800 cells/uL   Lymphs Abs 1,971 850 - 3,900 cells/uL   Absolute Monocytes 288 200 - 950 cells/uL   Eosinophils Absolute 50 15 - 500  cells/uL   Basophils Absolute 41 0 - 200 cells/uL   Neutrophils Relative % 47.8  %   Total Lymphocyte 43.8 %   Monocytes Relative 6.4 %   Eosinophils Relative 1.1 %   Basophils Relative 0.9 %      Assessment & Plan:   Problem List Items Addressed This Visit    None    Visit Diagnoses    History of anemia    -  Primary   Relevant Orders   CBC with Differential/Platelet (Completed)   Encounter to establish care       Obesity, Class I, BMI 30-34.9          Previous care was at [facility].  Records will not be requested.  Past medical, family, and surgical history reviewed w/ pt.  Obesity: Improving.  Patient has goal of BMI < 30.  Continue diet modifications.  Follow-up prn.  Anemia:  No recheck of hgb after pregnancy.  Status currently unknown.  CBC with diff today.  Follow-up prn.  May continue to take iron if needed.  Encouraged high iron diet.   Follow up plan: Return in about 6 weeks (around 04/26/2019) for annual physical plus presurgical EKG, xray.  Cassell Smiles, DNP, AGPCNP-BC Adult Gerontology Primary Care Nurse Practitioner [facility] 03/15/2019, 9:23 AM

## 2019-03-18 ENCOUNTER — Encounter: Payer: Self-pay | Admitting: Nurse Practitioner

## 2019-03-27 ENCOUNTER — Ambulatory Visit (HOSPITAL_COMMUNITY)
Admission: EM | Admit: 2019-03-27 | Discharge: 2019-03-27 | Disposition: A | Discharge status: Home / Self Care | Attending: Family Medicine | Admitting: Family Medicine

## 2019-03-27 ENCOUNTER — Encounter (HOSPITAL_COMMUNITY): Payer: Self-pay | Admitting: *Deleted

## 2019-03-27 ENCOUNTER — Other Ambulatory Visit: Payer: Self-pay

## 2019-03-27 DIAGNOSIS — J029 Acute pharyngitis, unspecified: Secondary | ICD-10-CM | POA: Diagnosis not present

## 2019-03-27 DIAGNOSIS — Z87891 Personal history of nicotine dependence: Secondary | ICD-10-CM | POA: Insufficient documentation

## 2019-03-27 DIAGNOSIS — Z20828 Contact with and (suspected) exposure to other viral communicable diseases: Secondary | ICD-10-CM | POA: Insufficient documentation

## 2019-03-27 DIAGNOSIS — R059 Cough, unspecified: Secondary | ICD-10-CM

## 2019-03-27 DIAGNOSIS — R05 Cough: Secondary | ICD-10-CM | POA: Diagnosis present

## 2019-03-27 LAB — POCT RAPID STREP A: Streptococcus, Group A Screen (Direct): NEGATIVE

## 2019-03-27 NOTE — ED Triage Notes (Signed)
C/o cough, sore throat, chills x 1 hr.  Unsure if fever.  Requesting Covid and strep test.

## 2019-03-27 NOTE — ED Provider Notes (Signed)
[facility]    CSN: [id number] Arrival date & time: 03/27/19  1016     History   Chief Complaint No chief complaint on file.   HPI [name] is a 38 y.o. female.   Patient is a 38 year old female with no significant past medical history.  She presents today with cough, sore throat, chills that started this morning.  Denies any known fever.  History of strep in the past.  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  She is here requesting COVID and strep screening.  No chest pain or shortness of breath.  ROS per HPI      Past Medical History:  Diagnosis Date  . Anemia   . Pregnant     Patient Active Problem List   Diagnosis Date Noted  . Postpartum care following vaginal delivery 05/08/2018  . Term pregnancy 05/07/2018  . VBAC (vaginal birth after Cesarean) 05/07/2018  . Abnormal glucose tolerance test (GTT) during pregnancy, antepartum 03/13/2018  . Routine physical examination 12/14/2016    Past Surgical History:  Procedure Laterality Date  . CESAREAN SECTION N/A 07/22/2016   Procedure: CESAREAN SECTION;  Surgeon: Christophe Louis, MD;  Location: [place];  Service: Obstetrics;  Laterality: N/A;    OB History    Gravida  4   Para  4   Term  2   Preterm  2   AB      Living  4     SAB      TAB      Ectopic      Multiple  0   Live Births  4            Home Medications    Prior to Admission medications   Medication Sig Start Date End Date Taking? Authorizing Provider  ferrous sulfate 325 (65 FE) MG tablet Take by mouth.   Yes [provider]  Multiple Vitamin (MULTIVITAMIN PO) Take by mouth.   Yes [provider]  Prenatal Vit-Fe Fumarate-FA (PRENATAL MULTIVITAMIN) TABS tablet Take 1 tablet by mouth daily at 12 noon.    [provider]    Family History Family History  Problem Relation Age of Onset  . Cancer Father        lung cancer  . Diabetes type II Paternal Grandmother   .  Colon cancer Neg Hx   . Breast cancer Neg Hx   . Ovarian cancer Neg Hx     Social History Social History   Tobacco Use  . Smoking status: Former Smoker    Packs/day: 0.50    Years: 3.00    Pack years: 1.50    Types: Cigarettes    Start date: 03/14/2013    Quit date: 03/14/2016    Years since quitting: 3.0  . Smokeless tobacco: Never Used  Substance Use Topics  . Alcohol use: No  . Drug use: No     Allergies   Patient has no known allergies.   Review of Systems Review of Systems   Physical Exam Triage Vital Signs ED Triage Vitals  Enc Vitals Group     BP 03/27/19 1033 122/87     Pulse Rate 03/27/19 1033 70     Resp --      Temp 03/27/19 1033 97.8 F (36.6 C)     Temp Source 03/27/19 1033 Temporal     SpO2 03/27/19 1033 99 %     Weight --      Height --  Head Circumference --      Peak Flow --      Pain Score 03/27/19 1034 3     Pain Loc --      Pain Edu? --      Excl. in [place]? --    No data found.  Updated Vital Signs BP 122/87   Pulse 70   Temp 97.8 F (36.6 C) (Temporal)   LMP 03/16/2019 (Approximate)   SpO2 99%   Breastfeeding No   Visual Acuity Right Eye Distance:   Left Eye Distance:   Bilateral Distance:    Right Eye Near:   Left Eye Near:    Bilateral Near:     Physical Exam Vitals signs and nursing note reviewed.  Constitutional:      General: She is not in acute distress.    Appearance: Normal appearance. She is not ill-appearing, toxic-appearing or diaphoretic.  HENT:     Head: Normocephalic and atraumatic.     Right Ear: Tympanic membrane and ear canal normal.     Left Ear: Tympanic membrane and ear canal normal.     Nose: Congestion present.     Mouth/Throat:     Pharynx: Oropharynx is clear. Posterior oropharyngeal erythema present.  Eyes:     Conjunctiva/sclera: Conjunctivae normal.  Neck:     Musculoskeletal: Normal range of motion.  Cardiovascular:     Rate and Rhythm: Normal rate and regular rhythm.  Pulmonary:      Effort: Pulmonary effort is normal.     Breath sounds: Normal breath sounds.  Musculoskeletal: Normal range of motion.  Lymphadenopathy:     Cervical: No cervical adenopathy.  Skin:    General: Skin is warm and dry.  Neurological:     Mental Status: She is alert.  Psychiatric:        Mood and Affect: Mood normal.      UC Treatments / Results  Labs (all labs ordered are listed, but only abnormal results are displayed) Labs Reviewed  NOVEL CORONAVIRUS, NAA (HOSPITAL ORDER, SEND-OUT TO REF LAB)  CULTURE, GROUP A STREP [facility])  POCT RAPID STREP A    EKG   Radiology No results found.  Procedures Procedures (including critical care time)  Medications Ordered in UC Medications - No data to display  Initial Impression / Assessment and Plan / UC Course  I have reviewed the triage vital signs and the nursing notes.  Pertinent labs & imaging results that were available during my care of the patient were reviewed by me and considered in my medical decision making (see chart for details).     Symptoms consistent with some sort of viral illness.  Low suspicion for COVID but still on the differential. Rapid strep test was negative.  Will send for culture. Quarantine and precautions given Over-the-counter symptomatic treatment as needed. Final Clinical Impressions(s) / UC Diagnoses   Final diagnoses:  Cough  Sore throat     Discharge Instructions     Your strep test was negative.  We will send this for culture. COVID testing done with results pending I would recommend quarantining and not working until we receive your results. Hopefully within the next 3 to 5 days. You can use over-the-counter medication for symptoms as needed Follow up as needed for continued or worsening symptoms     ED Prescriptions    None     Controlled Substance Prescriptions [place] Controlled Substance Registry consulted? Not Applicable   Orvan July, NP 03/27/19 1104

## 2019-03-27 NOTE — Discharge Instructions (Signed)
Your strep test was negative.  We will send this for culture. COVID testing done with results pending I would recommend quarantining and not working until we receive your results. Hopefully within the next 3 to 5 days. You can use over-the-counter medication for symptoms as needed Follow up as needed for continued or worsening symptoms

## 2019-03-29 LAB — NOVEL CORONAVIRUS, NAA (HOSP ORDER, SEND-OUT TO REF LAB; TAT 18-24 HRS): SARS-CoV-2, NAA: NOT DETECTED

## 2019-03-29 LAB — CULTURE, GROUP A STREP (THRC)

## 2019-03-30 ENCOUNTER — Encounter (HOSPITAL_COMMUNITY): Payer: Self-pay

## 2019-04-05 ENCOUNTER — Encounter: Payer: Self-pay | Admitting: Nurse Practitioner

## 2019-04-05 ENCOUNTER — Ambulatory Visit
Admission: RE | Admit: 2019-04-05 | Discharge: 2019-04-05 | Disposition: A | Discharge status: Home / Self Care | Source: Ambulatory Visit | Attending: Nurse Practitioner | Admitting: Nurse Practitioner

## 2019-04-05 ENCOUNTER — Ambulatory Visit: Admission: RE | Admit: 2019-04-05 | Source: Ambulatory Visit | Admitting: *Deleted

## 2019-04-05 ENCOUNTER — Ambulatory Visit (INDEPENDENT_AMBULATORY_CARE_PROVIDER_SITE_OTHER): Admitting: Nurse Practitioner

## 2019-04-05 ENCOUNTER — Other Ambulatory Visit: Payer: Self-pay

## 2019-04-05 VITALS — BP 120/72 | HR 81 | Ht 66.0 in | Wt 186.8 lb

## 2019-04-05 DIAGNOSIS — M25571 Pain in right ankle and joints of right foot: Secondary | ICD-10-CM

## 2019-04-05 DIAGNOSIS — M25471 Effusion, right ankle: Secondary | ICD-10-CM | POA: Insufficient documentation

## 2019-04-05 MED ORDER — PREDNISONE 10 MG PO TABS: ORAL_TABLET | ORAL | 0 refills | Status: AC

## 2019-04-05 NOTE — Progress Notes (Signed)
Subjective:    Patient ID: [name], female    DOB: 1980/08/25, 38 y.o.   MRN: [id number]  [name] is a 38 y.o. female presenting on 04/05/2019 for Foot Swelling (swelling in the Rt foot and ankle x 2 weeks. The swelling worse after been on her feet for a prolong time. She used compression stocking on yesterday and notice improvement )  HPI  RIGHT foot/ankle swelling Patient started having RIGHT foot and ankle swelling 2 weeks ago that is worse after dependent activities/standing.  She started compression stocking 1 day ago with some improvement.  - Patient has pain on top of foot/anterior ankle.   - No ankle sprain or trauma recently or remotely. - Swelling also occurs with rest and elevation. - Patient has been taking ibuprofen 400 mg q 6 hrs over last 1-2 weeks for pain control. - Has been using ice intermittently without relief.  Social History   Tobacco Use  . Smoking status: Former Smoker    Packs/day: 0.50    Years: 3.00    Pack years: 1.50    Types: Cigarettes    Start date: 03/14/2013    Quit date: 03/14/2016    Years since quitting: 3.0  . Smokeless tobacco: Never Used  Substance Use Topics  . Alcohol use: No  . Drug use: No    Review of Systems Per HPI unless specifically indicated above     Objective:    BP 120/72 (BP Location: Right Arm, Patient Position: Sitting, Cuff Size: Normal)   Pulse 81   Ht 5\' 6"  (1.676 m)   Wt 186 lb 12.8 oz (84.7 kg)   LMP 03/16/2019 (Approximate)   BMI 30.15 kg/m   Wt Readings from Last 3 Encounters:  04/05/19 186 lb 12.8 oz (84.7 kg)  03/15/19 187 lb 3.2 oz (84.9 kg)  11/03/18 170 lb (77.1 kg)    Physical Exam Vitals signs reviewed.  Constitutional:      General: She is not in acute distress.    Appearance: She is well-developed.  HENT:     Head: Normocephalic and atraumatic.  Musculoskeletal:     Right ankle: She exhibits swelling. She exhibits normal range of motion, no ecchymosis, no deformity, no  laceration and normal pulse. Tenderness (anterior ankle over tibia to 1st MT). No lateral malleolus, no medial malleolus, no AITFL, no CF ligament, no posterior TFL, no head of 5th metatarsal and no proximal fibula tenderness found. Achilles tendon normal.     Right foot: Decreased range of motion (decreased 1st MT flexion). Normal capillary refill. Tenderness (over first metatarsal at midfoot, radiates upward to tibia) and swelling (+1 non-pitting) present. No bony tenderness, crepitus, deformity or laceration.  Skin:    General: Skin is warm and dry.  Neurological:     Mental Status: She is alert and oriented to person, place, and time.  Psychiatric:        Behavior: Behavior normal.    Results for orders placed or performed during the hospital encounter of 03/27/19  Novel Coronavirus, NAA (hospital order; send-out to ref lab)   Specimen: Oropharyngeal swab; Respiratory  Result Value Ref Range   SARS-CoV-2, NAA NOT DETECTED NOT DETECTED   Coronavirus Source NASOPHARYNGEAL   Culture, group A strep   Specimen: Throat  Result Value Ref Range   Specimen Description THROAT    Special Requests NONE    Culture      NO GROUP A STREP (S.PYOGENES) ISOLATED Performed at [facility], [address] [place], [place] [address]    Report Status 03/29/2019 FINAL   POCT rapid strep A [facility])  Result Value Ref Range   Streptococcus, Group A Screen (Direct) NEGATIVE NEGATIVE      Assessment & Plan:   Problem List Items Addressed This Visit    None    Visit Diagnoses    Pain and swelling of right ankle    -  Primary   Relevant Medications   predniSONE (DELTASONE) 10 MG tablet   Other Relevant Orders   DG Foot Complete Right   DG Ankle Complete Right   Ambulatory referral to Podiatry    Acute ankle/foot pain and swelling - RIGHT Acute x 2 weeks ankle/foot pain and swelling with presumed 1st MT flexor tendon injury vs midfoot/ankle fracture.  Patient has performed adequate  conservative therapy to date.  Some improvement over last 1 day with compression sock.  Plan: 1. Foot and ankle xray 2. STOP NSAID while on prednisone - Start prednisone taper over 7 days Day 1-2: 60 mg, Day 3: 50 mg, Day 4: 40 mg; Day 5: 30 mg; Day 6 20 mg; Day 7: 10 mg then stop. - may continue OTC Tylenol for pain prn while on prednisone 3. Referral to podiatry additional evaluation 4. Continue compression sock or ankle brace. 5. Follow-up 2-4 weeks if no improvement      Meds ordered this encounter  Medications  . predniSONE (DELTASONE) 10 MG tablet    Sig: Day 1-2 take 6 pills. Day 3 take 5 pills then reduce by 1 pill each day.    Dispense:  27 tablet    Refill:  0    Order Specific Question:   Supervising Provider    Answer:   Olin Hauser [[id number]]   Follow up plan: Return 2-4 weeks if symptoms worsen or fail to improve.  Cassell Smiles, DNP, AGPCNP-BC Adult Gerontology Primary Care Nurse Practitioner [facility] 04/05/2019, 8:57 AM

## 2019-04-05 NOTE — Patient Instructions (Addendum)
[name],   Thank you for coming in to clinic today.  1. Continue compression sock or ankle brace  2. STOP ibuprofen while on prednisone - Continue OTC tylenol as needed on prednisone  Take prednisone taper 10 mg tablets Day 1 (Today): Take 6 pills at one time Day 2: Take 6 pills  Day 3: Take 5 pills Day 4: Take 4 pills Day 5: Take 3 pills Day 6: Take 2 pills Day 7: Take 1 pill then stop.  3. Referral to [facility].  Please schedule a follow-up appointment with Cassell Smiles, AGNP. Return 2-4 weeks if symptoms worsen or fail to improve.  If you have any other questions or concerns, please feel free to call the clinic or send a message through [facility]. You may also schedule an earlier appointment if necessary.  You will receive a survey after today's visit either digitally by e-mail or paper by [employer]. Your experiences and feedback matter to [place].  Please respond so we know how we are doing as we provide care for you.   Cassell Smiles, DNP, AGNP-BC Adult Gerontology Nurse Practitioner [place]

## 2019-05-11 ENCOUNTER — Encounter: Payer: Self-pay | Admitting: Nurse Practitioner

## 2019-05-14 ENCOUNTER — Ambulatory Visit
Admission: RE | Admit: 2019-05-14 | Discharge: 2019-05-14 | Disposition: A | Discharge status: Home / Self Care | Source: Ambulatory Visit | Attending: Family Medicine | Admitting: Family Medicine

## 2019-05-14 ENCOUNTER — Other Ambulatory Visit: Payer: Self-pay | Admitting: Family Medicine

## 2019-05-14 DIAGNOSIS — E6609 Other obesity due to excess calories: Secondary | ICD-10-CM

## 2019-07-08 IMAGING — US US MFM FETAL BPP W/O NON-STRESS
1 series · 15 of 28 positions shown · non-contrast
Comparison: none

[Series 1: us mfm fetal bpp w/o non-stress · 37 acquisitions, 15 frames shown]
[im 1/37]
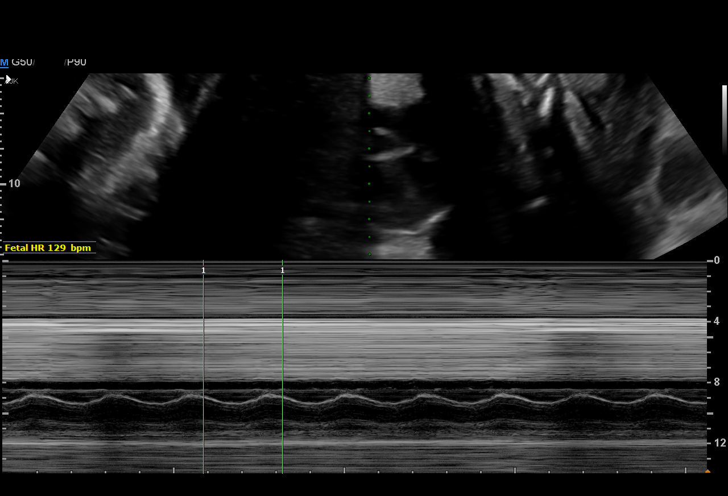
[im 3/37]
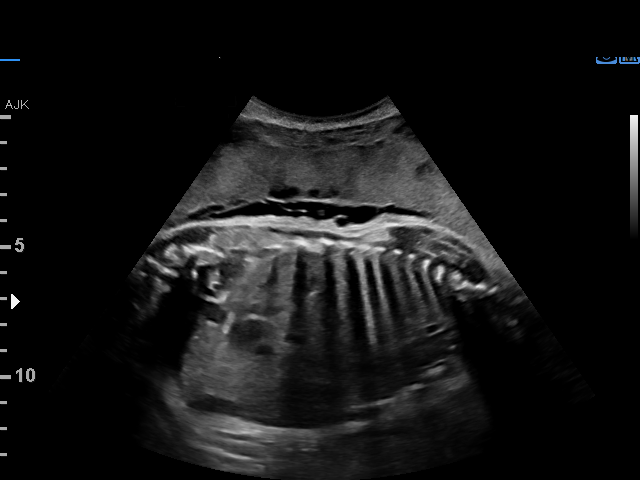
[im 6/37]
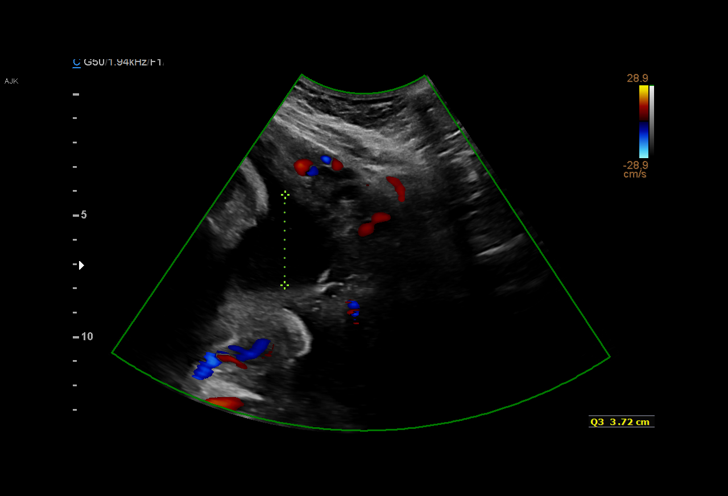
[im 9/37]
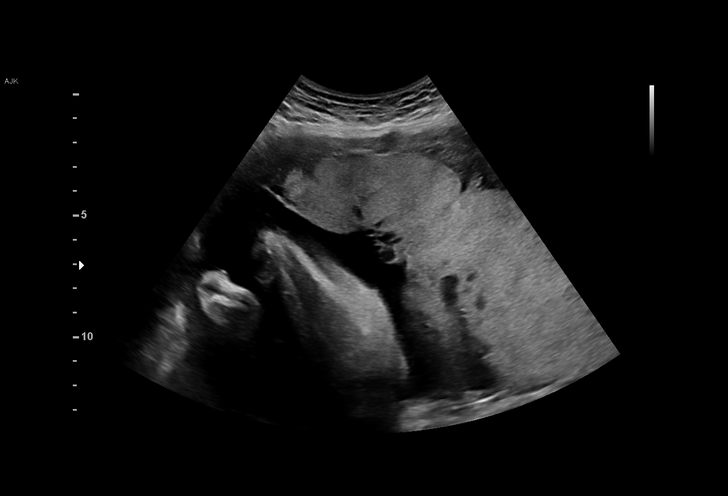
[im 11/37]
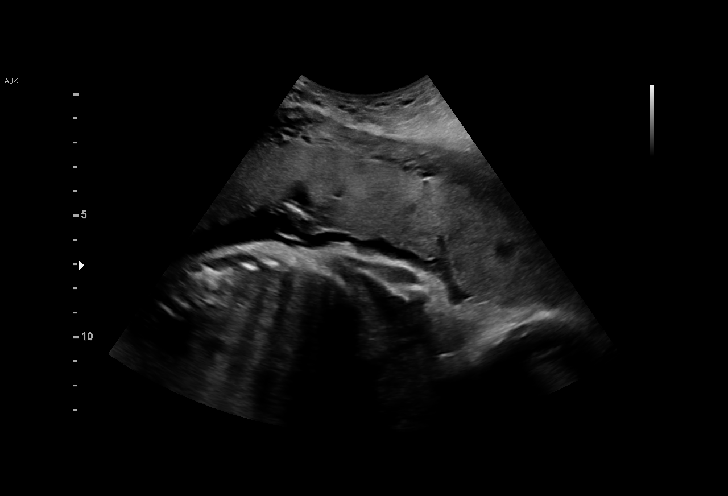
[im 14/37]
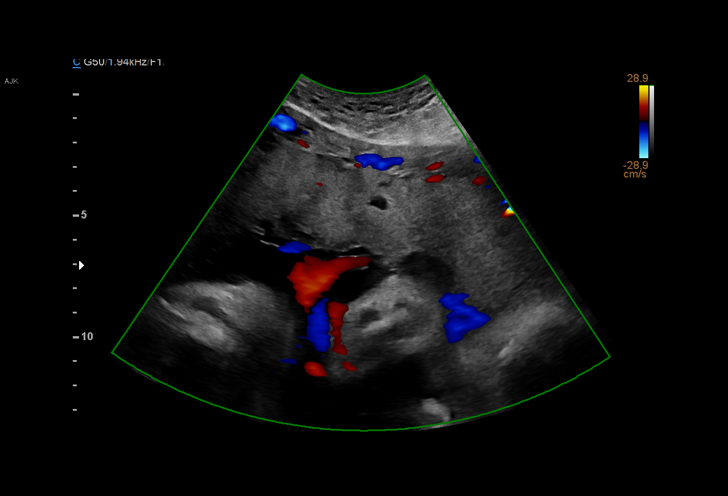
[im 17/37]
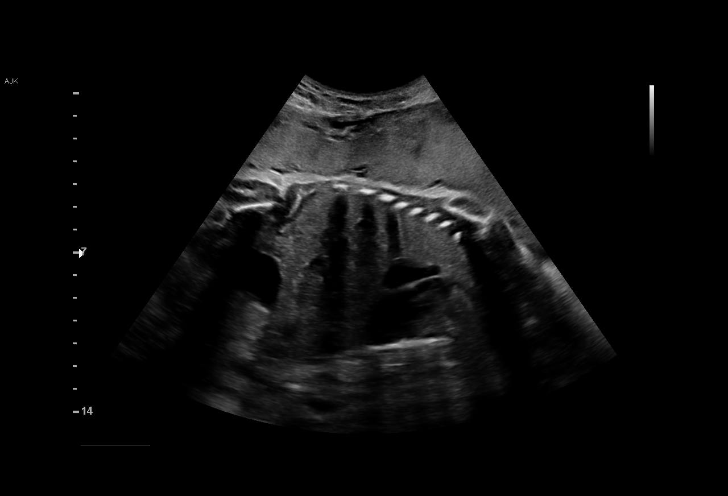
[im 19/37]
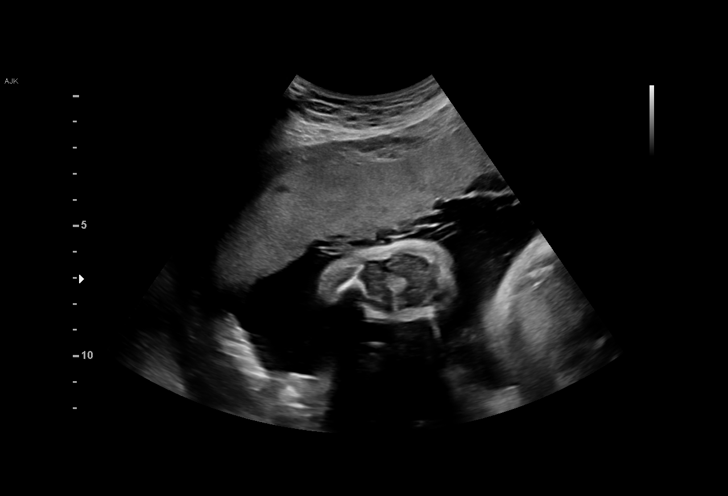
[im 21/37]
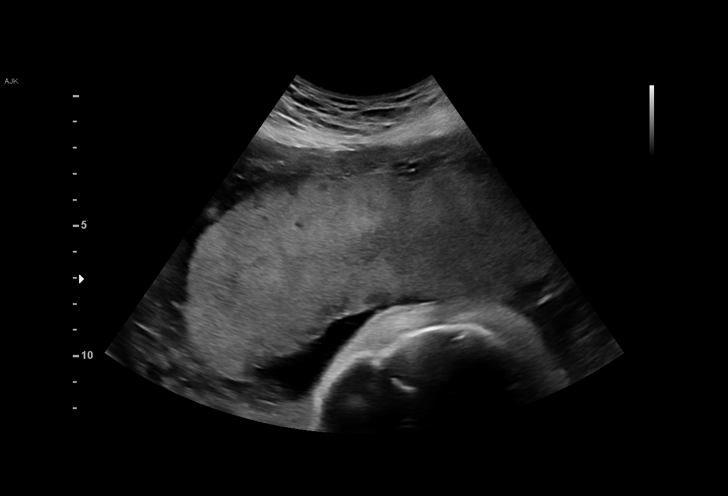
[im 23/37]
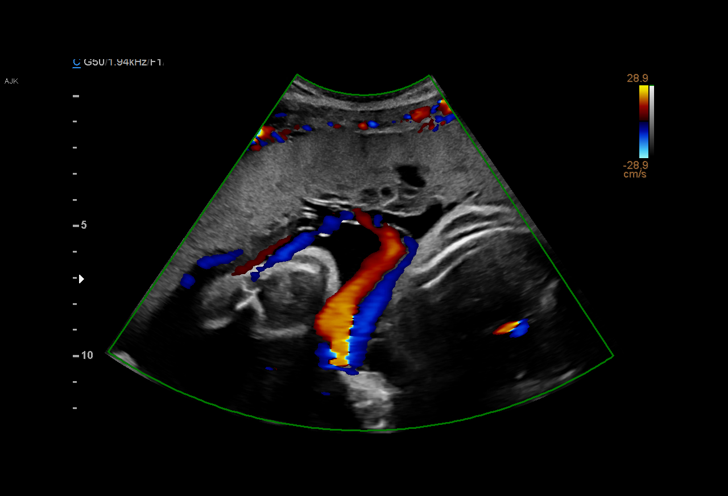
[im 26/37]
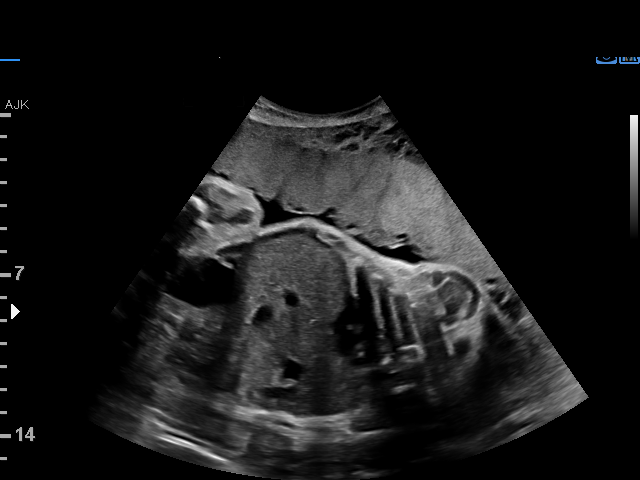
[im 29/37]
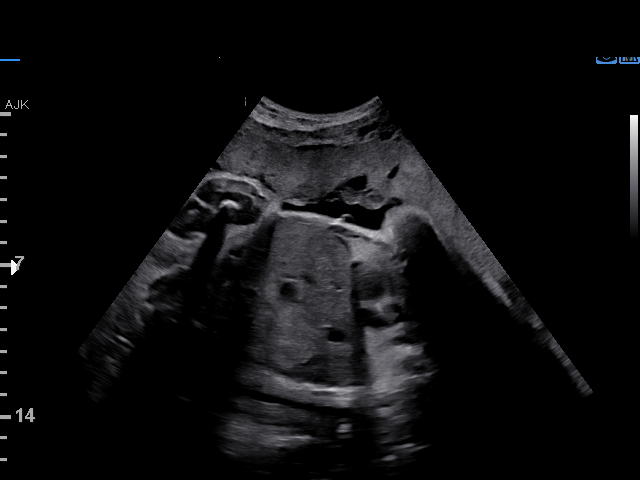
[im 31/37]
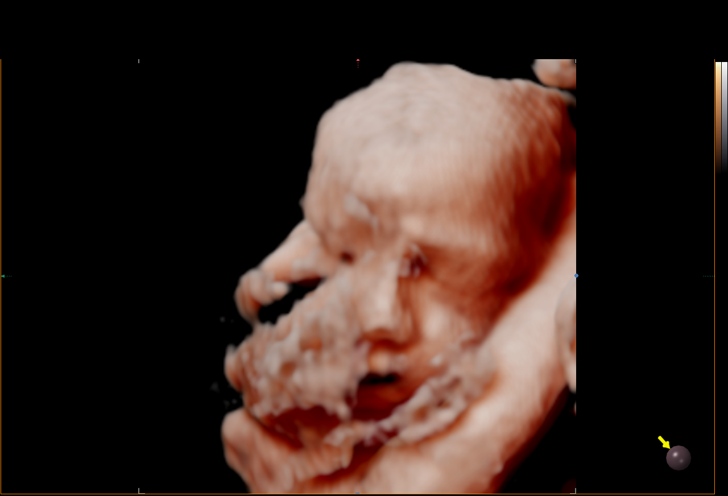
[im 34/37]
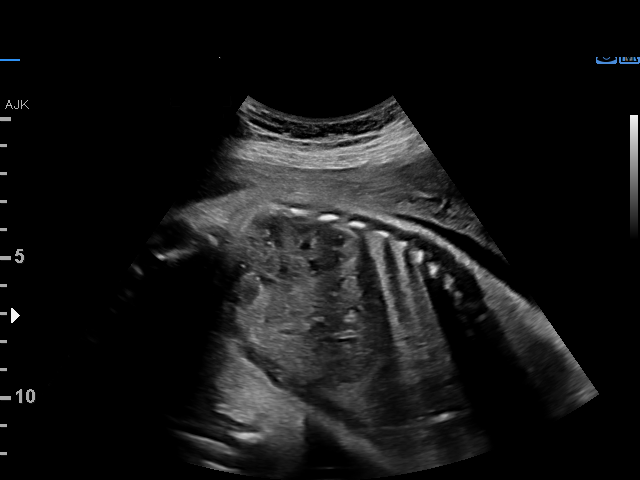
[im 37/37]
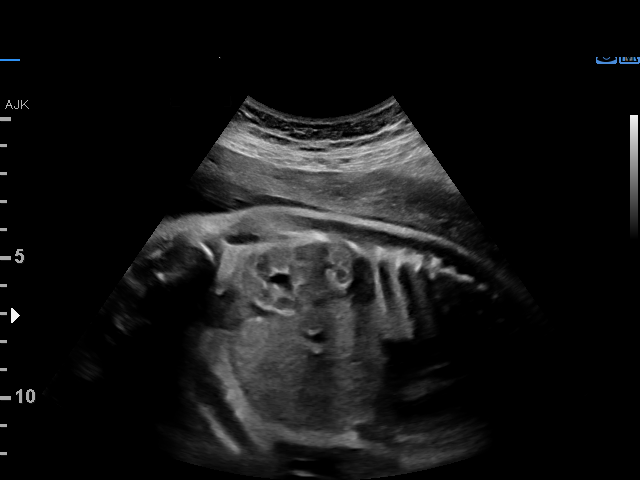

[15 of 28 positions shown; findings below may reference images not displayed]

1  NOMASIBULELE MOATSHE           [id number]      [id number]     [id number]
Indications

30 weeks gestation of pregnancy
Decreased fetal movements, third trimester,
unspecified
Poor obstetric history: Previous preterm
delivery, antepartum (x2 36,35 weeks)
Obesity complicating pregnancy, third
trimester
OB History

Blood Type:            Height:  5'6"   Weight (lb):  211       BMI:
Gravidity:    4         Term:   1        Prem:   2        SAB:   0
TOP:          0       Ectopic:  0        Living: 3
Fetal Evaluation

Num Of Fetuses:     1
Fetal Heart         129
Rate(bpm):
Cardiac Activity:   Observed
Presentation:       Cephalic
Placenta:           Anterior
P. Cord Insertion:  Visualized, central

Amniotic Fluid
AFI FV:      Subjectively within normal limits

AFI Sum(cm)     %Tile       Largest Pocket(cm)
18.3            69

RUQ(cm)       RLQ(cm)       LUQ(cm)        LLQ(cm)
5.97

Comment:    Breathing noted intermittently, but not sustained.
Biophysical Evaluation
Amniotic F.V:   Within normal limits       F. Tone:        Observed
F. Movement:    Observed                   Score:          [DATE]
F. Breathing:   Not Observed
Gestational Age

Clinical EDD:  30w 5d                                        EDD:   05/28/18
Best:          30w 5d     Det. By:  Clinical EDD             EDD:   05/28/18
Impression

Patient was evaluated for decreased fetal movements (AWA).
She had reactive NST.
Amniotic fluid is normal and good fetal activity is seen. BPP
[DATE].
Recommendations

Follow-up scans as clinically indicated.

## 2019-07-10 ENCOUNTER — Other Ambulatory Visit: Payer: Self-pay

## 2019-07-10 DIAGNOSIS — Z20822 Contact with and (suspected) exposure to covid-19: Secondary | ICD-10-CM

## 2019-07-12 LAB — NOVEL CORONAVIRUS, NAA: SARS-CoV-2, NAA: NOT DETECTED

## 2020-07-19 IMAGING — CR RIGHT FOOT COMPLETE - 3+ VIEW
1 series · 3 of 3 positions shown · non-contrast
Comparison: None.

CLINICAL DATA: Medial foot and ankle pain radiating superiorly

EXAM:
RIGHT FOOT COMPLETE - 3+ VIEW

[Series 1: dg foot complete right · 0.14mm/px · 3 of 3 slices shown]
[im 1/3]
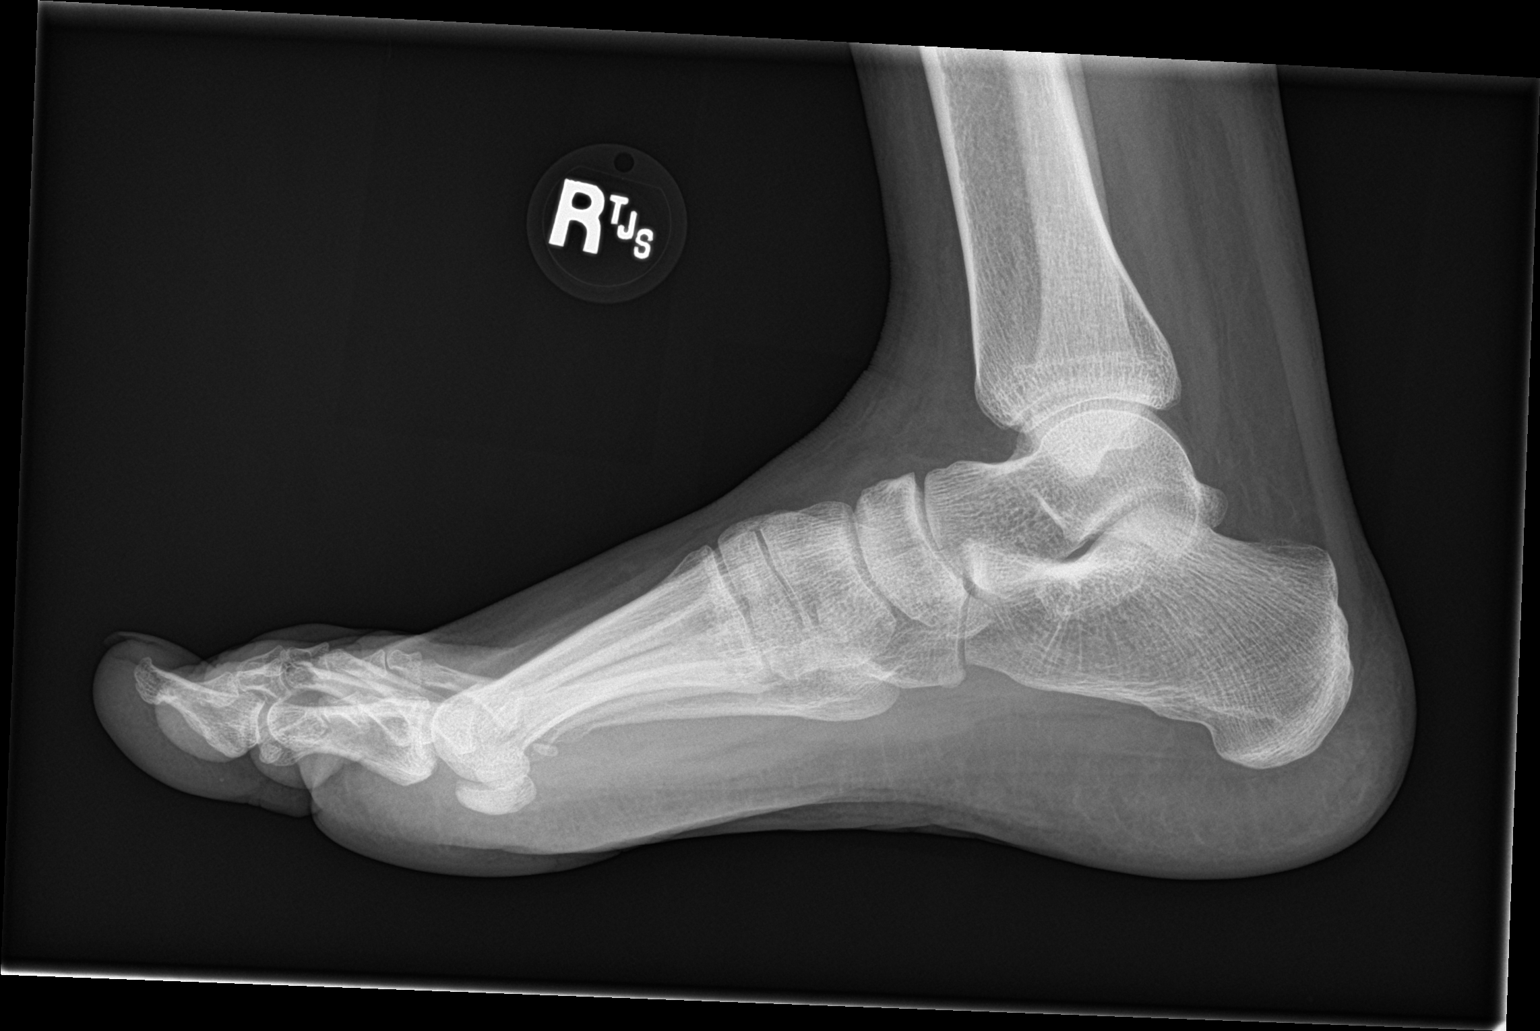
[im 2/3]
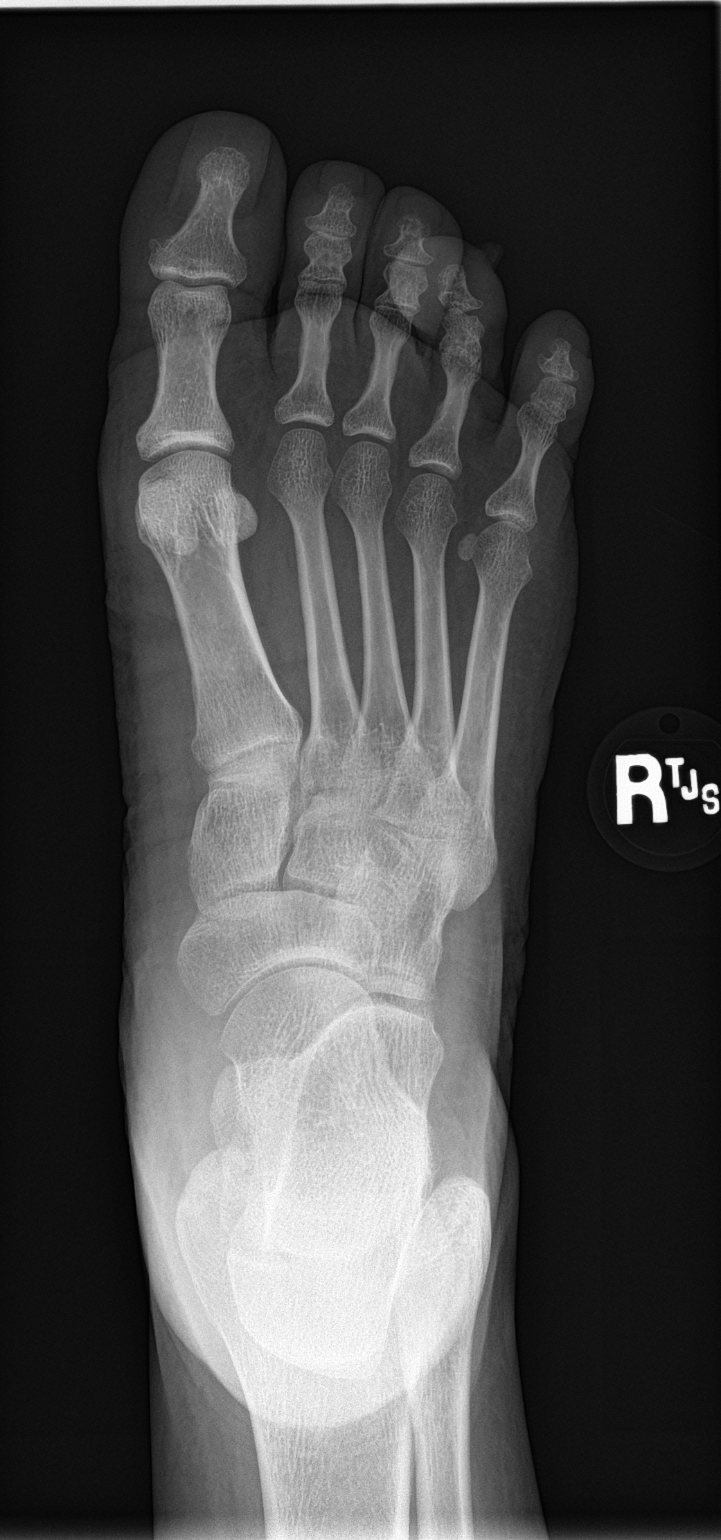
[im 3/3]
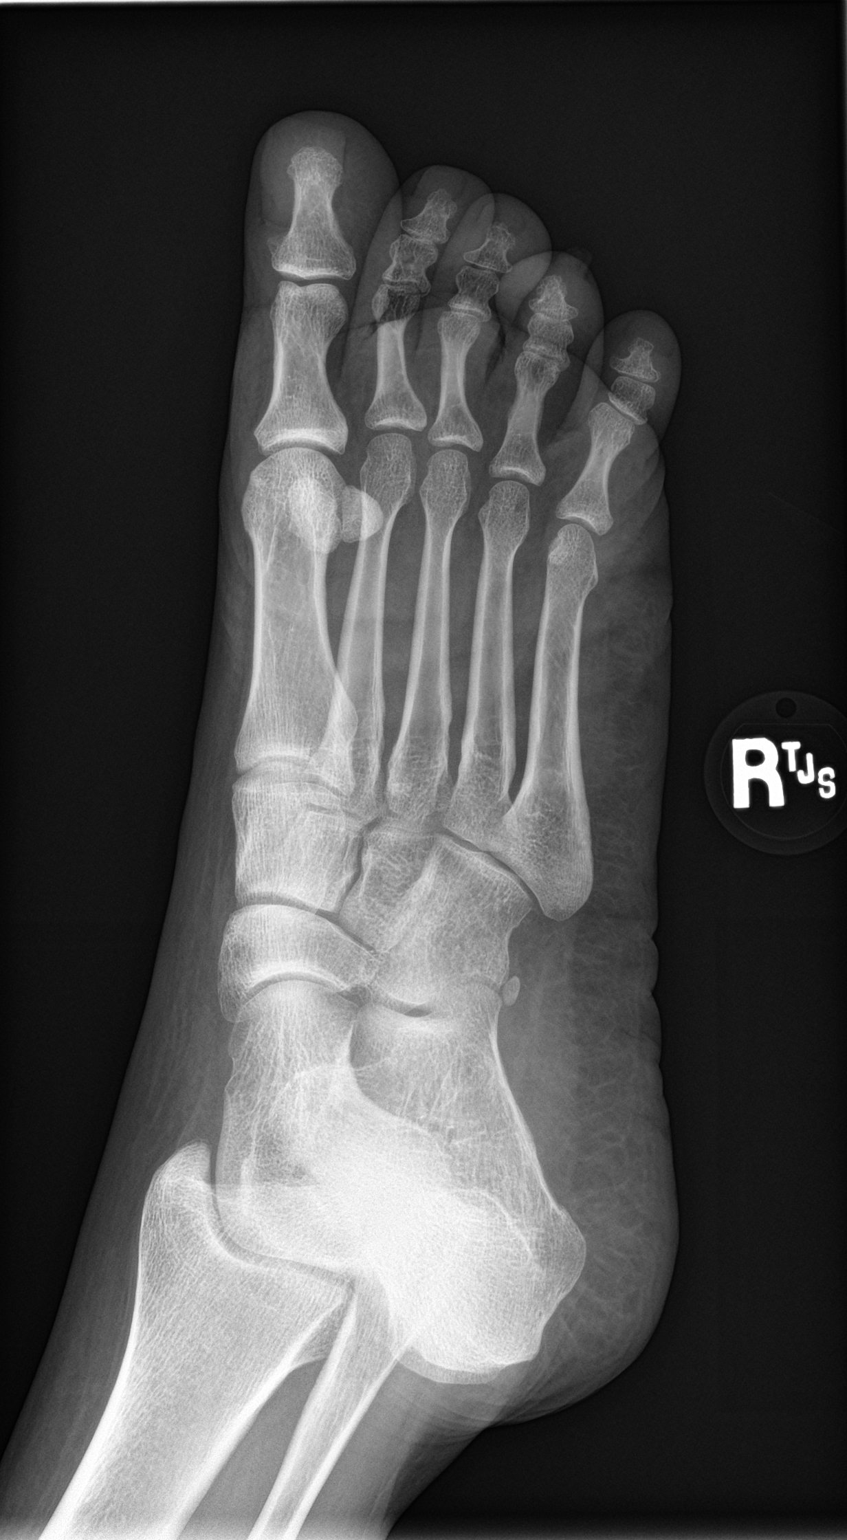

[3 of 3 positions shown; findings below may reference images not displayed]

FINDINGS: There is no evidence of fracture or dislocation. Well corticated os
peroneum. There is no evidence of arthropathy or other focal bone
abnormality. Slight clawtoe deformity of the fourth digit with
irregularity of the nail bed. Soft tissues are otherwise
unremarkable.
IMPRESSION: No acute osseous abnormality.

## 2020-07-19 IMAGING — CR RIGHT ANKLE - COMPLETE 3+ VIEW
1 series · 3 of 3 positions shown · non-contrast
Comparison: Same-day right foot radiographs

CLINICAL DATA: Right foot and ankle pain radiating from the medial
midfoot to shin

EXAM:
RIGHT ANKLE - COMPLETE 3+ VIEW

[Series 1: dg ankle complete right · 0.14mm/px · 3 of 3 slices shown]
[im 1/3]
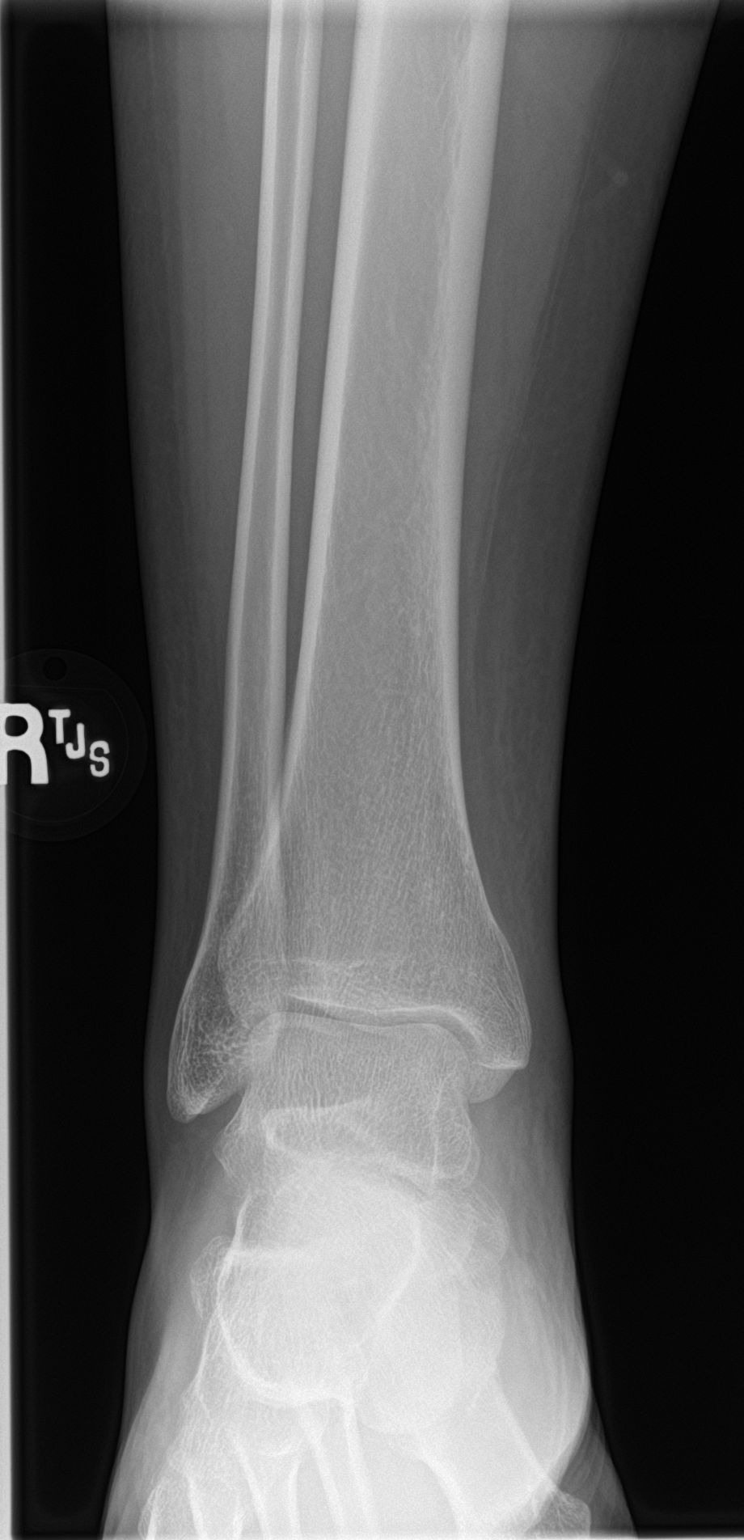
[im 2/3]
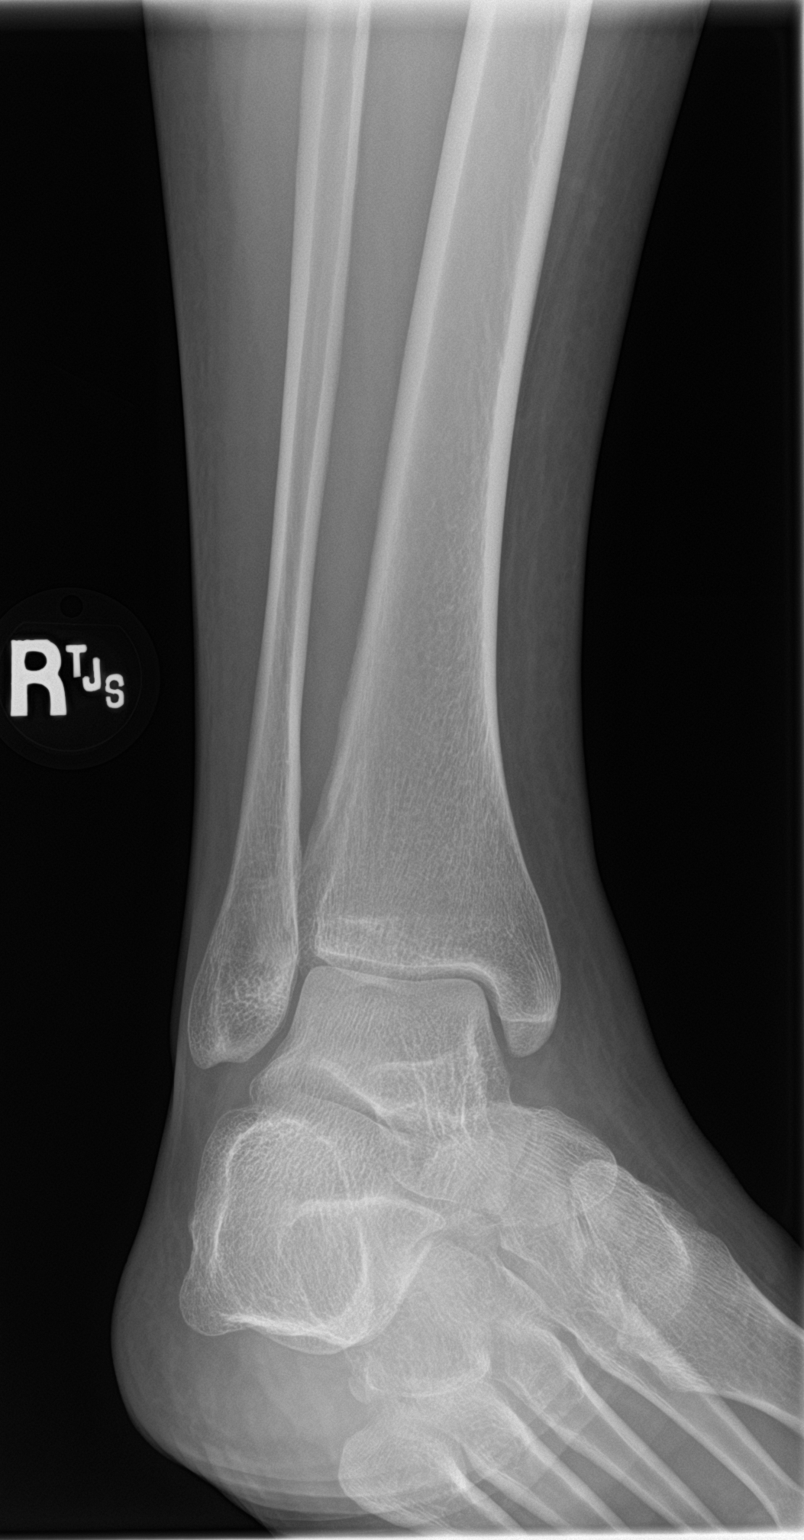
[im 3/3]
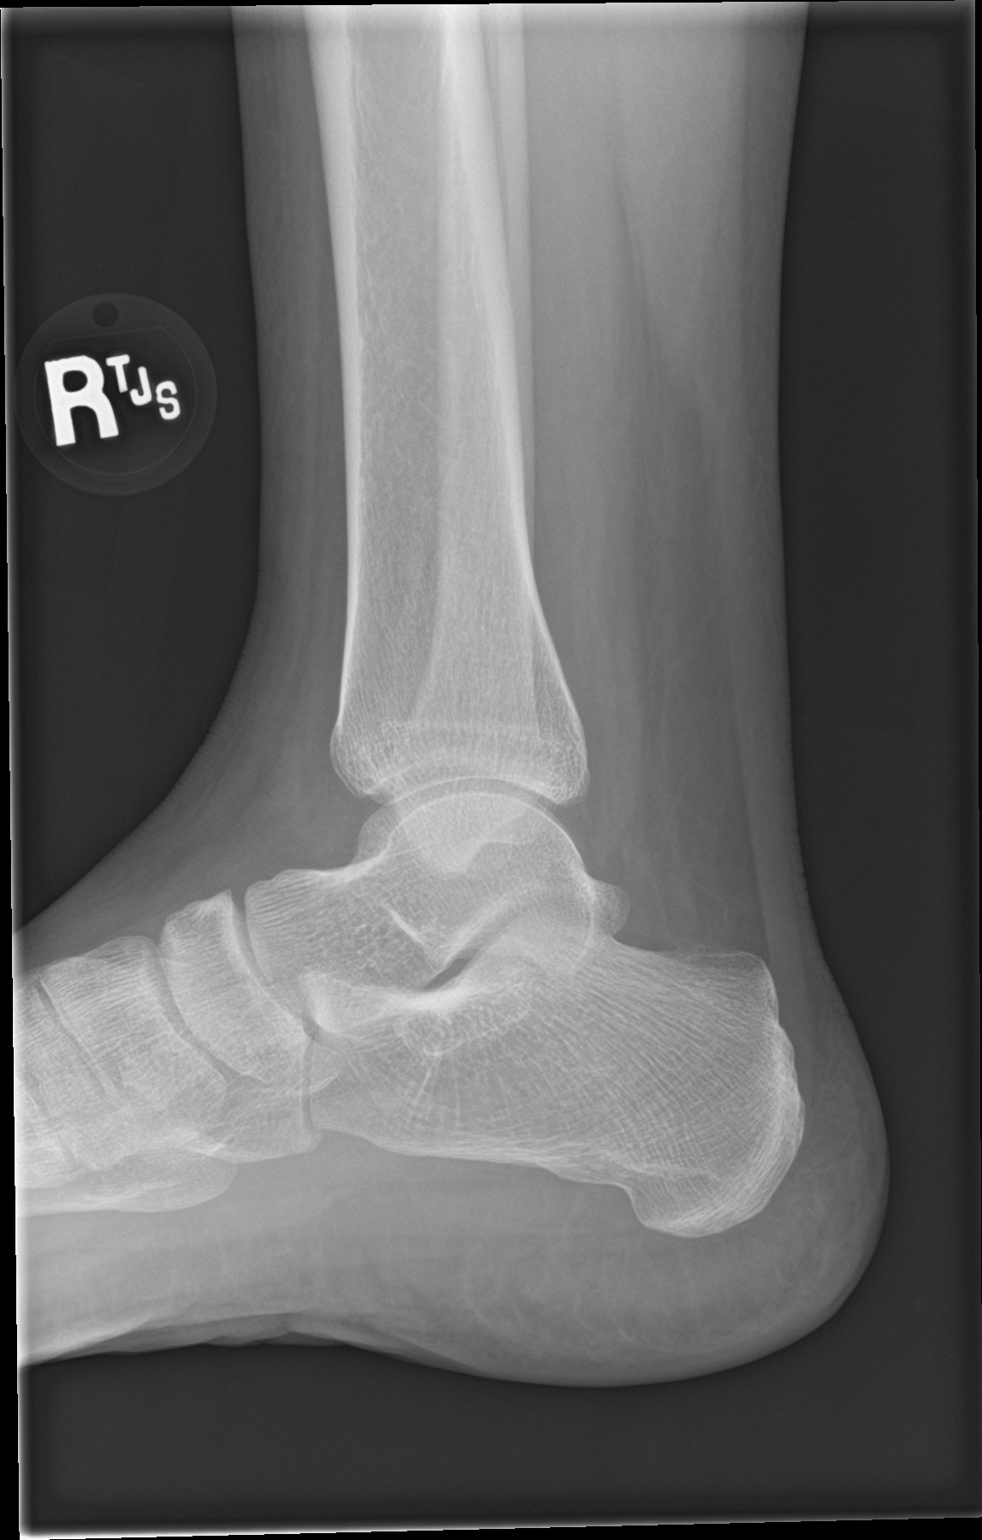

[3 of 3 positions shown; findings below may reference images not displayed]

FINDINGS: There is no evidence of fracture, dislocation, or joint effusion.
Ankle mortise is congruent. There is no evidence of arthropathy or
other focal bone abnormality. Soft tissues are unremarkable.
IMPRESSION: Negative.
